# Patient Record
Sex: Female | Born: 1970 | Race: White | Hispanic: No | Marital: Single | Smoking: Never smoker
Health system: Southern US, Community
[De-identification: ages and names within clinical notes are randomized; demographics above are authoritative.]

## PROBLEM LIST (undated history)

## (undated) DIAGNOSIS — E039 Hypothyroidism, unspecified: Secondary | ICD-10-CM

## (undated) DIAGNOSIS — E559 Vitamin D deficiency, unspecified: Secondary | ICD-10-CM

## (undated) DIAGNOSIS — B360 Pityriasis versicolor: Secondary | ICD-10-CM

## (undated) DIAGNOSIS — F419 Anxiety disorder, unspecified: Secondary | ICD-10-CM

## (undated) HISTORY — DX: Hypothyroidism, unspecified: E03.9

## (undated) HISTORY — DX: Anxiety disorder, unspecified: F41.9

## (undated) HISTORY — DX: Vitamin D deficiency, unspecified: E55.9

## (undated) HISTORY — DX: Pityriasis versicolor: B36.0

---

## 2008-11-03 ENCOUNTER — Ambulatory Visit (HOSPITAL_COMMUNITY): Admission: RE | Admit: 2008-11-03 | Discharge: 2008-11-03 | Payer: Self-pay | Admitting: Internal Medicine

## 2008-11-18 ENCOUNTER — Encounter (HOSPITAL_COMMUNITY): Admission: RE | Admit: 2008-11-18 | Discharge: 2008-12-30 | Payer: Self-pay | Admitting: Internal Medicine

## 2009-07-07 ENCOUNTER — Ambulatory Visit (HOSPITAL_COMMUNITY): Admission: RE | Admit: 2009-07-07 | Discharge: 2009-07-07 | Payer: Self-pay | Admitting: Internal Medicine

## 2010-01-22 ENCOUNTER — Encounter: Payer: Self-pay | Admitting: Internal Medicine

## 2010-12-11 ENCOUNTER — Ambulatory Visit
Admission: RE | Admit: 2010-12-11 | Discharge: 2010-12-11 | Disposition: A | Discharge status: Home / Self Care | Payer: BC Managed Care – PPO | Source: Ambulatory Visit | Attending: Internal Medicine | Admitting: Internal Medicine

## 2010-12-11 ENCOUNTER — Other Ambulatory Visit: Payer: Self-pay | Admitting: Internal Medicine

## 2010-12-11 DIAGNOSIS — Z331 Pregnant state, incidental: Secondary | ICD-10-CM

## 2012-02-22 IMAGING — US US OB TRANSVAGINAL
1 series · 14 of 28 positions shown · non-contrast
Comparison: None.

CLINICAL DATA: Positive urine pregnancy test.  Irregular menstrual
cycles.

OBSTETRIC <14 WK US AND TRANSVAGINAL OB US
TECHNIQUE: Both transabdominal and transvaginal ultrasound
examinations were performed for complete evaluation of the
gestation as well as the maternal uterus, adnexal regions, and
pelvic cul-de-sac.  Transvaginal technique was performed to assess
early pregnancy.

[Series 1: us ob transvaginal · 0.25mm/px · 14 of 65 slices shown]
[im 3/65]
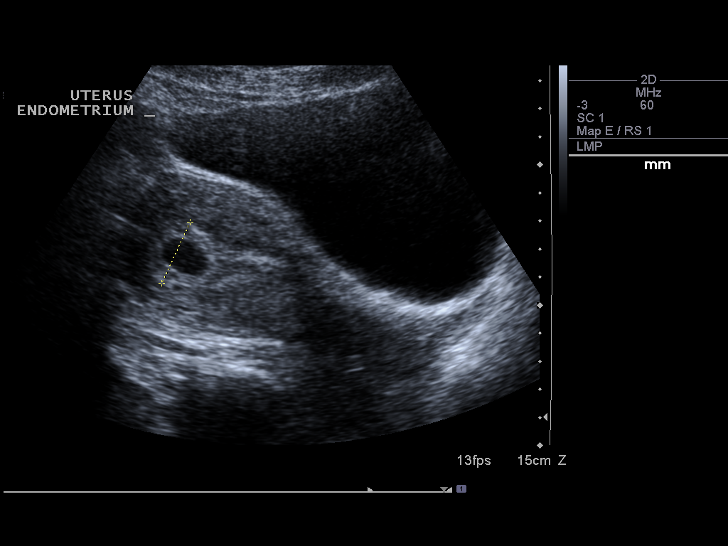
[im 8/65]
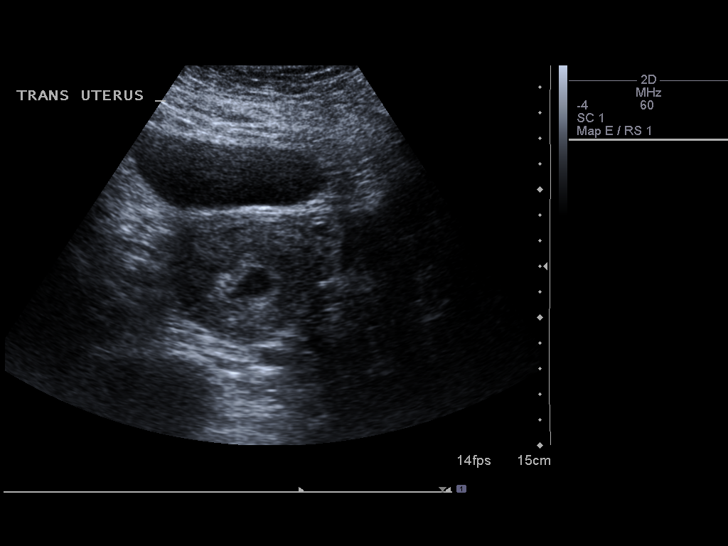
[im 12/65]
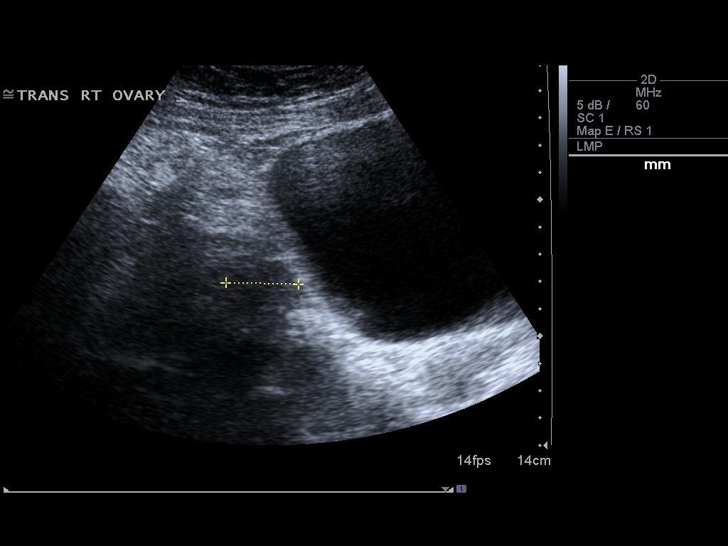
[im 17/65]
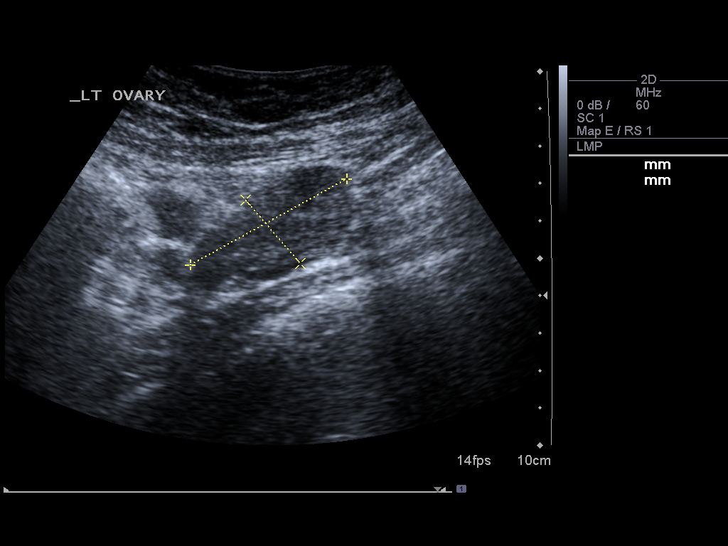
[im 22/65]
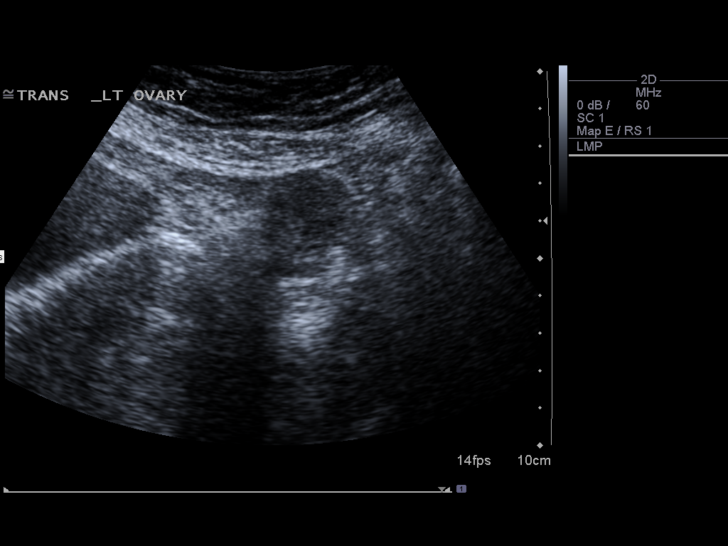
[im 27/65]
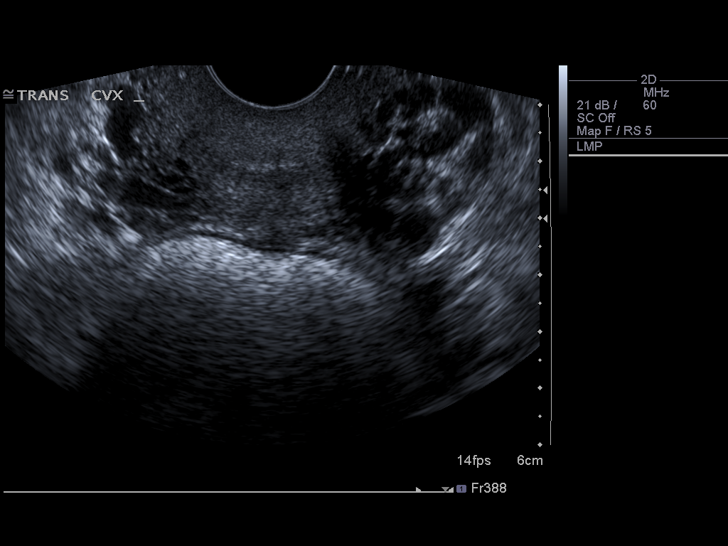
[im 31/65]
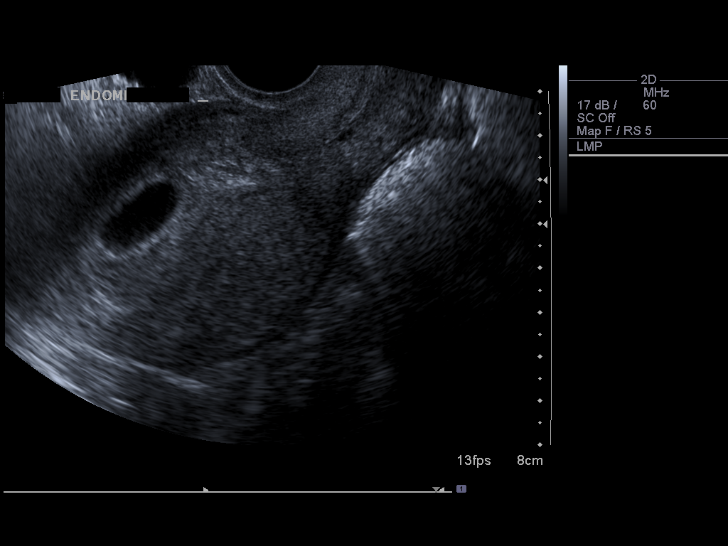
[im 36/65]
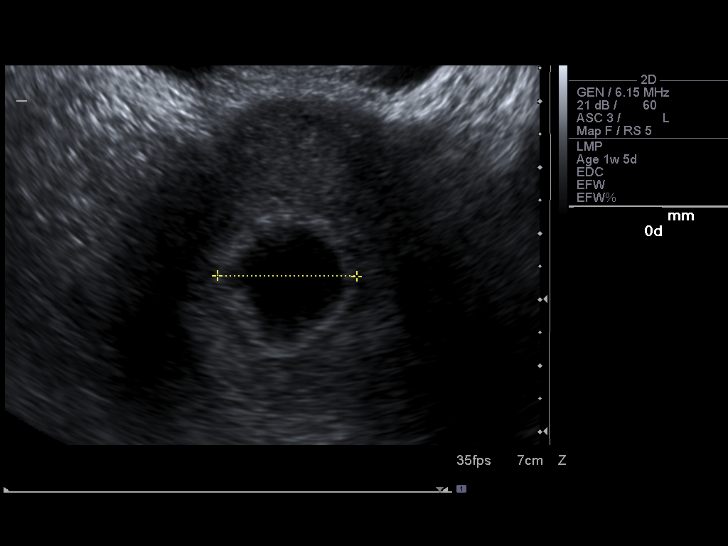
[im 41/65]
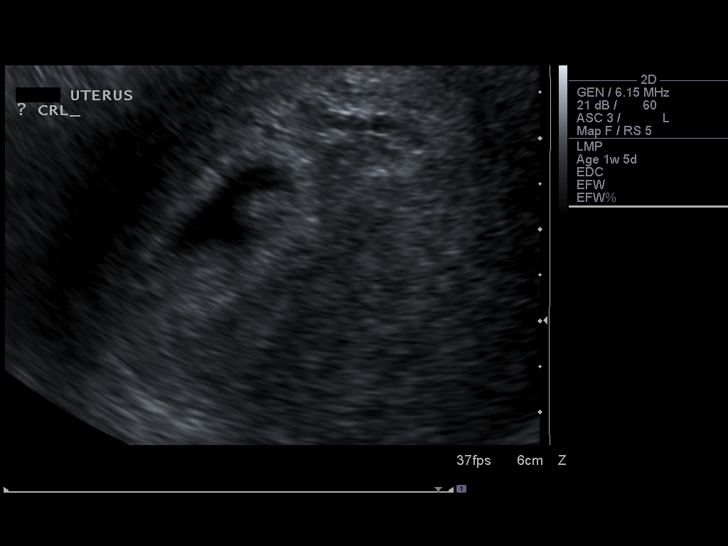
[im 46/65]
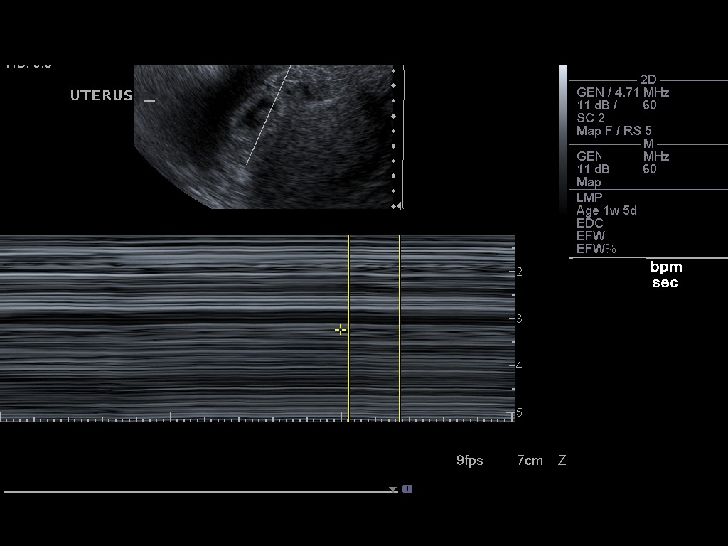
[im 50/65]
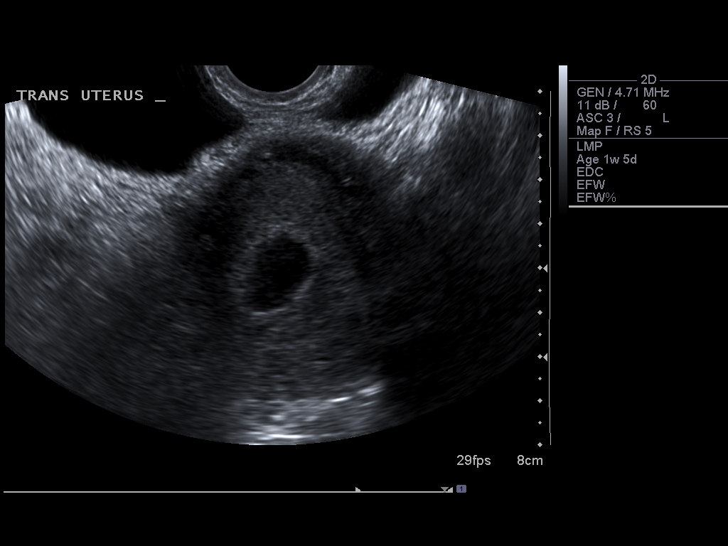
[im 55/65]
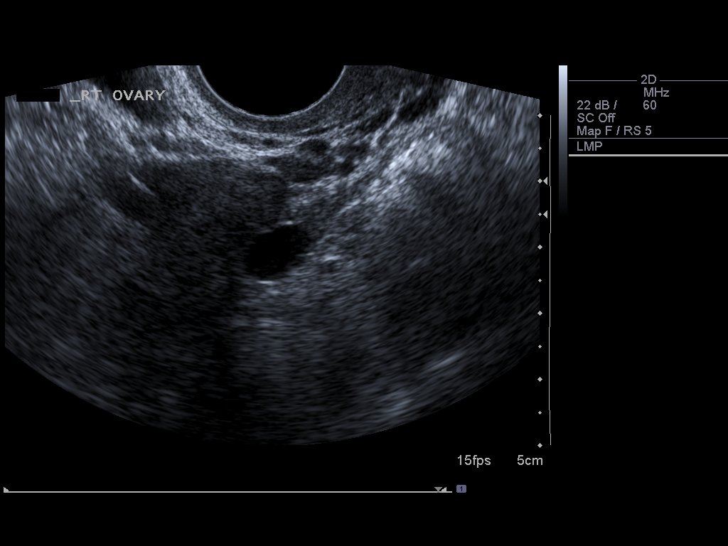
[im 60/65]
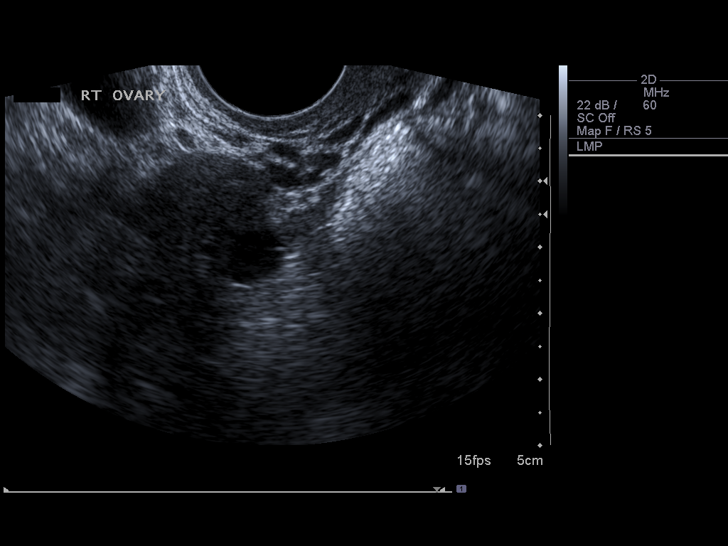
[im 65/65]
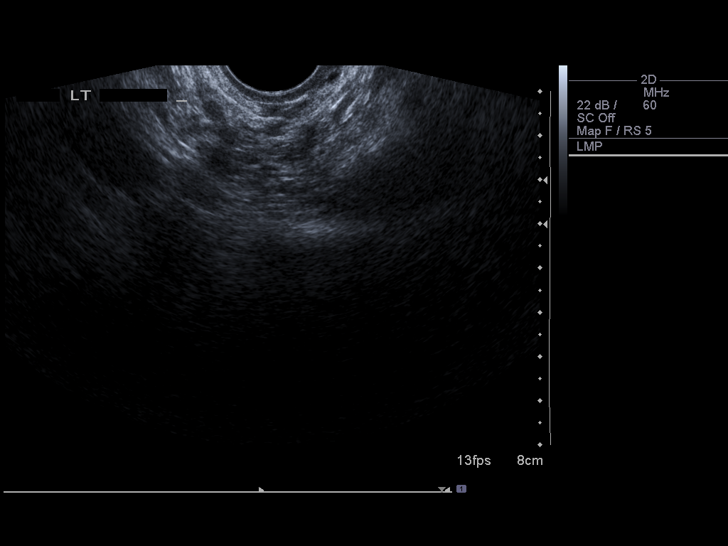

[14 of 28 positions shown; findings below may reference images not displayed]

Intrauterine gestational sac:  Visualized/normal in shape.
Yolk sac: Visualized
Embryo: Visualized
Cardiac Activity: Visualized
Heart Rate: 200 bpm

MSD: 19.3  mm  6    w 6    d
CRL: 7.1   mm  6   w  4   d         US EDC: 08/01/2011

Maternal uterus/adnexae:
No evidence of subchorionic hemorrhage.  The maternal right ovary
is 2.5 x 2.4 x 2.4 cm and contains a partially exophytic follicle
versus corpus luteum that measures 11 mm.  The maternal left ovary
measures 3.2 x 4.8 x 2.2 cm and has normal appearances.  No free
pelvic fluid is seen.
IMPRESSION: Single living intrauterine pregnancy.  Estimated gestational age by
crown-rump length is 6 weeks 4 days with an EDC of 08/01/2011.

age is recommended.

## 2013-09-27 DIAGNOSIS — B360 Pityriasis versicolor: Secondary | ICD-10-CM | POA: Insufficient documentation

## 2013-09-27 DIAGNOSIS — E039 Hypothyroidism, unspecified: Secondary | ICD-10-CM | POA: Insufficient documentation

## 2013-09-27 DIAGNOSIS — E559 Vitamin D deficiency, unspecified: Secondary | ICD-10-CM | POA: Insufficient documentation

## 2013-09-27 DIAGNOSIS — F419 Anxiety disorder, unspecified: Secondary | ICD-10-CM | POA: Insufficient documentation

## 2013-09-28 ENCOUNTER — Encounter: Payer: Self-pay | Admitting: Physician Assistant

## 2013-12-04 ENCOUNTER — Encounter: Payer: Self-pay | Admitting: *Deleted

## 2013-12-04 ENCOUNTER — Emergency Department
Admission: EM | Admit: 2013-12-04 | Discharge: 2013-12-04 | Disposition: A | Discharge status: Home / Self Care | Payer: BLUE CROSS/BLUE SHIELD | Source: Home / Self Care | Attending: Family Medicine | Admitting: Family Medicine

## 2013-12-04 DIAGNOSIS — I8312 Varicose veins of left lower extremity with inflammation: Secondary | ICD-10-CM

## 2013-12-04 DIAGNOSIS — I8311 Varicose veins of right lower extremity with inflammation: Secondary | ICD-10-CM | POA: Diagnosis not present

## 2013-12-04 DIAGNOSIS — I872 Venous insufficiency (chronic) (peripheral): Secondary | ICD-10-CM

## 2013-12-04 MED ORDER — HYDROXYZINE HCL 25 MG PO TABS: ORAL_TABLET | ORAL | Status: DC

## 2013-12-04 MED ORDER — TRIAMCINOLONE ACETONIDE 0.1 % EX CREA: TOPICAL_CREAM | CUTANEOUS | Status: AC

## 2013-12-04 NOTE — ED Notes (Signed)
Pt c/o itching rash on her lower legs bilaterally x 1 mth. She reports having this rash 2 times a year. Denies any recent changes to her diet, soap or laundry detergent.

## 2013-12-04 NOTE — Discharge Instructions (Signed)
Minimize baths/showers.  Use a mild bath soap containing oil such as unscented Dove.  Apply a moisturizing cream or lotion immediately after bathing while still wet, then towel dry.  Recommend wearing below the knee support hose daytime (mild support: 8-515mm HG compression)   Stasis Dermatitis Stasis dermatitis occurs when veins lose the ability to pump blood back to the heart (poor venous circulation). It causes a reddish-purple to brownish scaly, itchy rash on the legs. The rash comes from pooling of blood (stasis). CAUSES  This occurs because the veins do not work very well anymore or because pressure may be increased in the veins due to other conditions. With blood pooling, the increased pressure in the tiny blood vessels (capillaries) causes fluid to leak out of the capillaries into the tissue. The extra fluid makes it harder for the blood to feed the cells and get rid of waste products. SYMPTOMS  Stasis dermatitis appears as red, scaly, itchy patches on the legs. A yellowish or light brown discoloration is also present. Due to scratching or other injury, these patches can become an ulcer. This ulcer may remain for long periods of time. The ulcer can also become infected. Swelling of the legs is often present with stasis dermatitis. If the leg is swollen, this increases the risk of infection and further damage to the skin. Sometimes, intense itching, tingling, and burning occurs before signs of stasis dermatitis appear. You may find yourself scratching the insides of your ankles or rubbing your ankles together before the rash appears. After healing, there are often brown spots on the affected skin. DIAGNOSIS  Your caregiver makes this diagnosis based on an exam. Other tests may be done to better understand the cause. TREATMENT  If underlying conditions are present, they must be treated. Some of these conditions are heart failure, thyroid problems, poor nutrition, and varicose veins.  Cortisone  creams and ointments applied to the skin (topically) may be needed, as well as medicine to reduce swelling in the legs (diuretics).  Compression stockings or an elastic wrap may also be needed to reduce swelling.  If there is an infection, antibiotic medicines may also be used. HOME CARE INSTRUCTIONS   Try to rest and raise (elevate) the affected leg above the level of the heart, if possible.  Burow's solution wet packs applied for 30 minutes, 3 times daily, will help the weepy rash. Stop using the packs before your skin gets too dry. You can also use a mixture of 3 parts white vinegar to 1 quart water.  Grease your legs daily with ointments, such as petroleum jelly, to fight dryness.  Avoid scratching or injuring the area. SEEK IMMEDIATE MEDICAL CARE IF:   Your rash gets worse.  An ulcer forms.  You have an oral temperature above 102 F (38.9 C), not controlled by medicine.  You have any other severe symptoms. Document Released: 03/29/2005 Document Revised: 03/12/2011 Document Reviewed: 05/09/2009 [facility] Patient Information 2015 [place], [place]. This information is not intended to replace advice given to you by your health care provider. Make sure you discuss any questions you have with your health care provider.    Compression Stockings Compression stockings are elastic stockings that "compress" your legs. This helps to increase blood flow, decrease swelling, and reduces the chance of getting blood clots in your lower legs. Compression stockings are used:  After surgery.  If you have a history of poor circulation.  If you are prone to blood clots.  If you have varicose veins.  If you sit or are bedridden for long periods of time. WEARING COMPRESSION STOCKINGS  Your compression stockings should be worn as instructed by your caregiver.  Wearing the correct stocking size is important. Your caregiver can help measure and fit you to the correct size.  When wearing your  stockings, do not allow the stockings to bunch up. This is especially important around your toes or behind your knees. Keep the stockings as smooth as possible.  Do not roll the stockings downward and leave them rolled down. This can form a restrictive band around your legs and can decrease blood flow.  The stockings should be removed once a day for 1 hour or as instructed by your caregiver. When the stockings are taken off, inspect your legs and feet. Look for:  Open sores.  Red spots.  Puffy areas (swelling).  Anything that does not seem normal. IMPORTANT INFORMATION ABOUT COMPRESSION STOCKINGS  The compression stockings should be clean, dry, and in good condition before you put them on.  Do not put lotion on your legs or feet. This makes it harder to put the stockings on.  Change your stockings immediately if they become wet or soiled.  Do not wear stockings that are ripped or torn.  You may hand-wash or put your stockings in the washing machine. Use cold or warm water with mild detergent. Do not bleach your stockings. They may be air-dried or dried in the dryer on low heat.  If you have pain or have a feeling of "pins and needles" in your feet or legs, you may be wearing stockings that are too tight. Call your caregiver right away. SEEK IMMEDIATE MEDICAL CARE IF:   You have numbness or tingling in your lower legs that does not get better quickly after the stockings are removed.  Your toes or feet become cold and blue.  You develop open sores or have red spots on your legs that do not go away. MAKE SURE YOU:   Understand these instructions.  Will watch your condition.  Will get help right away if you are not doing well or get worse. Document Released: 10/15/2008 Document Revised: 03/12/2011 Document Reviewed: 10/15/2008 [facility] Patient Information 2015 [place], [place]. This information is not intended to replace advice given to you by your health care provider. Make sure  you discuss any questions you have with your health care provider.

## 2013-12-04 NOTE — ED Provider Notes (Signed)
CSN: [id number]     Arrival date & time 12/04/13  1645 History   First MD Initiated Contact with Patient 12/04/13 1740     Chief Complaint  Patient presents with  . Rash      HPI Comments: Patient complains of recurring pruritic rash on both lower legs below the knee for about a month.  She states that she generally has the rash in spring and fall.  She states that the rash first appeared 19 years ago during the third trimester of her pregnancy when she had significant swelling in her lower legs.  She notes that her legs swell during the day.  She usually develops itching of her lower legs, and rash appears when she begins to scratch them.  She notes that the itching awakens her at night. She has had good response in the past to triamcinolone cream.  Patient is a 43 y.o. female presenting with rash. The history is provided by the patient.  Rash Location: lower legs. Quality comment:  Itching Severity:  Mild Onset quality:  Gradual Duration:  1 month Timing:  Constant Progression:  Worsening Chronicity:  Recurrent Context: not animal contact, not chemical exposure, not eggs, not hot tub use, not insect bite/sting, not medications, not new detergent/soap and not plant contact   Relieved by:  Topical steroids Ineffective treatments:  Antihistamines Associated symptoms: no fatigue, no fever, no joint pain, no myalgias, no shortness of breath, no sore throat and no URI     Past Medical History  Diagnosis Date  . Hypothyroid   . Anxiety   . Tinea versicolor   . Vitamin D deficiency    History reviewed. No pertinent past surgical history. Family History  Problem Relation Age of Onset  . Hypertension Mother   . Arthritis Mother    History  Substance Use Topics  . Smoking status: Never Smoker   . Smokeless tobacco: Never Used  . Alcohol Use: No     Comment: Rare   OB History    No data available     Review of Systems  Constitutional: Negative for fever and fatigue.  HENT:  Negative for sore throat.   Respiratory: Negative for shortness of breath.   Musculoskeletal: Negative for myalgias and arthralgias.  Skin: Positive for rash.  All other systems reviewed and are negative.   Allergies  Shellfish allergy  Home Medications   Prior to Admission medications   Medication Sig Start Date End Date Taking? Authorizing Provider  hydrOXYzine (ATARAX/VISTARIL) 25 MG tablet Take one or two tabs by mouth at bedtime as needed for itching 12/04/13   Lattie HawStephen A Beese, MD  levothyroxine (SYNTHROID, LEVOTHROID) 50 MCG tablet Take 50 mcg by mouth daily before breakfast.    Historical Provider, MD  triamcinolone cream (KENALOG) 0.1 % Apply thin application to lower legs twice daily until condition improves. 12/04/13   Lattie HawStephen A Beese, MD   BP 126/82 mmHg  Pulse 83  Temp(Src) 97.6 F (36.4 C) (Oral)  Resp 18  Ht 5\' 7"  (1.702 m)  Wt 182 lb (82.555 kg)  BMI 28.50 kg/m2  SpO2 100%  LMP 11/30/2013 Physical Exam  Constitutional: She is oriented to person, place, and time. She appears well-developed and well-nourished. No distress.  HENT:  Head: Normocephalic.  Mouth/Throat: Oropharynx is clear and moist.  Eyes: Conjunctivae are normal. Pupils are equal, round, and reactive to light.  Neck: Neck supple.  Cardiovascular: Normal heart sounds.   Pulmonary/Chest: Breath sounds normal.  Abdominal: There is  no tenderness.  Musculoskeletal: She exhibits edema. She exhibits no tenderness.       Right lower leg: She exhibits edema. She exhibits no tenderness, no bony tenderness and no swelling.       Left lower leg: She exhibits edema.       Legs: Lower extremities below the knees have trace edema.  There is scattered follicular erythema as noted on diagram.  Right lateral pre-tibial area has macular erythematous chronic changes as noted on diagram.      Lymphadenopathy:    She has no cervical adenopathy.  Neurological: She is alert and oriented to person, place, and time.    Skin: Skin is warm and dry.  Nursing note and vitals reviewed.   ED Course  Procedures  none      MDM   1. Acute venous stasis dermatitis of both lower extremities    Begin triamcinolone 0.1% cream BID until rash improves.  Vistaril at bedtime for itching. Minimize baths/showers.  Use a mild bath soap containing oil such as unscented Dove.  Apply a moisturizing cream or lotion immediately after bathing while still wet, then towel dry.  Recommend wearing below the knee support hose daytime (mild support: 8-1415mm HG compression) Followup with dermatologist if not improving.    Lattie HawStephen A Beese, MD 12/08/13 [phone]

## 2017-07-03 ENCOUNTER — Other Ambulatory Visit: Payer: Self-pay

## 2017-07-03 ENCOUNTER — Emergency Department (INDEPENDENT_AMBULATORY_CARE_PROVIDER_SITE_OTHER): Payer: BLUE CROSS/BLUE SHIELD

## 2017-07-03 ENCOUNTER — Emergency Department (INDEPENDENT_AMBULATORY_CARE_PROVIDER_SITE_OTHER)
Admission: EM | Admit: 2017-07-03 | Discharge: 2017-07-03 | Disposition: A | Discharge status: Home / Self Care | Payer: BLUE CROSS/BLUE SHIELD | Source: Home / Self Care | Attending: Family Medicine | Admitting: Family Medicine

## 2017-07-03 DIAGNOSIS — M25512 Pain in left shoulder: Secondary | ICD-10-CM

## 2017-07-03 MED ORDER — HYDROCODONE-ACETAMINOPHEN 5-325 MG PO TABS: 1.0000 | ORAL_TABLET | Freq: Four times a day (QID) | ORAL | 0 refills | Status: DC | PRN

## 2017-07-03 MED ORDER — METHOCARBAMOL 500 MG PO TABS: 500.0000 mg | ORAL_TABLET | Freq: Two times a day (BID) | ORAL | 0 refills | Status: DC

## 2017-07-03 NOTE — ED Provider Notes (Signed)
[facility]    CSN: [id number] Arrival date & time: 07/03/17  0944     History   Chief Complaint Chief Complaint  Patient presents with  . Shoulder Pain    left    HPI Yolanda Kramer is a 47 y.o. female.   HPI Yolanda Kramer is a 47 y.o. female presenting to UC with c/o Left shoulder pain that start a few months ago. Pain is a dull ache, worse when lying on her Left side and radiates around her Left scapula.  Pain initially started one night after going to the gym earlier in the day but no specific known injury.  She has decreased ROM due to the pain.  She has taken Aleve but minimal relief.  Denies numbness down her arm.  No prior hx of shoulder issues.   Past Medical History:  Diagnosis Date  . Anxiety   . Hypothyroid   . Tinea versicolor   . Vitamin D deficiency     Patient Active Problem List   Diagnosis Date Noted  . Hypothyroid   . Anxiety   . Tinea versicolor   . Vitamin D deficiency     History reviewed. No pertinent surgical history.  OB History   None      Home Medications    Prior to Admission medications   Medication Sig Start Date End Date Taking? Authorizing Provider  HYDROcodone-acetaminophen (NORCO/VICODIN) 5-325 MG tablet Take 1 tablet by mouth every 6 (six) hours as needed. 07/03/17   Lurene ShadowPhelps, Denene Alamillo O, PA-C  hydrOXYzine (ATARAX/VISTARIL) 25 MG tablet Take one or two tabs by mouth at bedtime as needed for itching 12/04/13   Lattie HawBeese, Stephen A, MD  levothyroxine (SYNTHROID, LEVOTHROID) 50 MCG tablet Take 50 mcg by mouth daily before breakfast.    [provider]  methocarbamol (ROBAXIN) 500 MG tablet Take 1 tablet (500 mg total) by mouth 2 (two) times daily. 07/03/17   Lurene ShadowPhelps, Joslynn Jamroz O, PA-C  triamcinolone cream (KENALOG) 0.1 % Apply thin application to lower legs twice daily until condition improves. 12/04/13   Lattie HawBeese, Stephen A, MD    Family History Family History  Problem Relation Age of Onset  . Hypertension Mother   .  Arthritis Mother     Social History Social History   Tobacco Use  . Smoking status: Never Smoker  . Smokeless tobacco: Never Used  Substance Use Topics  . Alcohol use: No    Comment: Rare  . Drug use: No     Allergies   Shellfish allergy   Review of Systems Review of Systems  Musculoskeletal: Positive for arthralgias, back pain and myalgias.  Skin: Negative for color change and rash.  Neurological: Positive for weakness. Negative for numbness.     Physical Exam Triage Vital Signs ED Triage Vitals [07/03/17 1003]  Enc Vitals Group     BP 125/79     Pulse Rate 80     Resp      Temp 98.2 F (36.8 C)     Temp Source Oral     SpO2 97 %     Weight 209 lb (94.8 kg)     Height 5\' 7"  (1.702 m)     Head Circumference      Peak Flow      Pain Score 7     Pain Loc      Pain Edu?      Excl. in GC?    No data found.  Updated Vital Signs BP  125/79 (BP Location: Right Arm)   Pulse 80   Temp 98.2 F (36.8 C) (Oral)   Ht 5\' 7"  (1.702 m)   Wt 209 lb (94.8 kg)   SpO2 97%   BMI 32.73 kg/m   Visual Acuity Right Eye Distance:   Left Eye Distance:   Bilateral Distance:    Right Eye Near:   Left Eye Near:    Bilateral Near:     Physical Exam  Constitutional: She is oriented to person, place, and time. She appears well-developed and well-nourished. No distress.  HENT:  Head: Normocephalic and atraumatic.  Eyes: EOM are normal.  Neck: Normal range of motion.  Cardiovascular: Normal rate.  Pulses:      Radial pulses are 2+ on the left side.  Pulmonary/Chest: Effort normal.  Musculoskeletal: She exhibits tenderness. She exhibits no edema.       Left shoulder: She exhibits decreased range of motion and tenderness.       Arms: Left shoulder: decreased ROM, tenderness from Left side neck around scapula and shoulder joint.  Left elbow: non-tender, full ROM  Neurological: She is alert and oriented to person, place, and time.  Skin: Skin is warm and dry. Capillary  refill takes less than 2 seconds. She is not diaphoretic. No erythema.  Psychiatric: She has a normal mood and affect. Her behavior is normal.  Nursing note and vitals reviewed.    UC Treatments / Results  Labs (all labs ordered are listed, but only abnormal results are displayed) Labs Reviewed - No data to display  EKG None  Radiology Dg Shoulder Left  Result Date: 07/03/2017 CLINICAL DATA:  Pain for 2 months EXAM: LEFT SHOULDER - 2+ VIEW COMPARISON:  None. FINDINGS: Frontal, Y scapular, and axillary images were obtained. No fracture or dislocation. Joint spaces appear normal. No erosive change. Visualized left lung clear. IMPRESSION: No fracture or dislocation.  No evident arthropathy. Electronically Signed   By: Bretta Bang III M.D.   On: 07/03/2017 10:32    Procedures Procedures (including critical care time)  Medications Ordered in UC Medications - No data to display  Initial Impression / Assessment and Plan / UC Course  I have reviewed the triage vital signs and the nursing notes.  Pertinent labs & imaging results that were available during my care of the patient were reviewed by me and considered in my medical decision making (see chart for details).     Discussed plain films with pt. Given severity of pain and decreased ROM, questionable small RCT vs tendonitis. Home care instructions provided below.    Final Clinical Impressions(s) / UC Diagnoses   Final diagnoses:  Left shoulder pain     Discharge Instructions      Norco/Vicodin (hydrocodone-acetaminophen) is a narcotic pain medication, do not combine these medications with others containing tylenol. While taking, do not drink alcohol, drive, or perform any other activities that requires focus while taking these medications.   Robaxin (methocarbamol) is a muscle relaxer and may cause drowsiness. Do not drink alcohol, drive, or operate heavy machinery while taking.  You may also continue to take your  Advil or Aleve during the day and at night to help with inflammation and pain.  Do not take more than recommended on product packaging as this could cause stomach upset and even stomach ulcers if taking for prolonged time.  Please follow up with Sports Medicine in 1-2 weeks if not improving.  You may need additional imaging with an ultrasound or MRI  and may benefit from a shoulder joint injection or physical therapy.     ED Prescriptions    Medication Sig Dispense Auth. Provider   methocarbamol (ROBAXIN) 500 MG tablet Take 1 tablet (500 mg total) by mouth 2 (two) times daily. 20 tablet Lurene Shadow, PA-C   HYDROcodone-acetaminophen (NORCO/VICODIN) 5-325 MG tablet Take 1 tablet by mouth every 6 (six) hours as needed. 12 tablet Lurene Shadow, PA-C     Controlled Substance Prescriptions [place] Controlled Substance Registry consulted? Yes, I have consulted the [place] Controlled Substances Registry for this patient, and feel the risk/benefit ratio today is favorable for proceeding with this prescription for a controlled substance.   Lurene Shadow, [place] 07/03/17 1142

## 2017-07-03 NOTE — Discharge Instructions (Signed)
°  Norco/Vicodin (hydrocodone-acetaminophen) is a narcotic pain medication, do not combine these medications with others containing tylenol. While taking, do not drink alcohol, drive, or perform any other activities that requires focus while taking these medications.   Robaxin (methocarbamol) is a muscle relaxer and may cause drowsiness. Do not drink alcohol, drive, or operate heavy machinery while taking.  You may also continue to take your Advil or Aleve during the day and at night to help with inflammation and pain.  Do not take more than recommended on product packaging as this could cause stomach upset and even stomach ulcers if taking for prolonged time.  Please follow up with Sports Medicine in 1-2 weeks if not improving.  You may need additional imaging with an ultrasound or MRI and may benefit from a shoulder joint injection or physical therapy.

## 2017-07-03 NOTE — ED Triage Notes (Signed)
Has been having left shoulder pain for a couple of months.  Has been a dull ache and hurts worse when lying on that side.  Pain down the back of arm, limited ROM.

## 2017-08-30 ENCOUNTER — Emergency Department (INDEPENDENT_AMBULATORY_CARE_PROVIDER_SITE_OTHER)
Admission: EM | Admit: 2017-08-30 | Discharge: 2017-08-30 | Disposition: A | Discharge status: Home / Self Care | Payer: BLUE CROSS/BLUE SHIELD | Source: Home / Self Care | Attending: Family Medicine | Admitting: Family Medicine

## 2017-08-30 ENCOUNTER — Other Ambulatory Visit: Payer: Self-pay

## 2017-08-30 ENCOUNTER — Encounter: Payer: Self-pay | Admitting: *Deleted

## 2017-08-30 DIAGNOSIS — M25512 Pain in left shoulder: Secondary | ICD-10-CM

## 2017-08-30 DIAGNOSIS — G8929 Other chronic pain: Secondary | ICD-10-CM | POA: Diagnosis not present

## 2017-08-30 MED ORDER — MELOXICAM 15 MG PO TABS: 15.0000 mg | ORAL_TABLET | Freq: Every day | ORAL | 1 refills | Status: DC

## 2017-08-30 NOTE — ED Provider Notes (Signed)
[facility]    CSN: [id number] Arrival date & time: 08/30/17  1549     History   Chief Complaint Chief Complaint  Patient presents with  . Shoulder Pain    HPI Yolanda Maudlinancy L Kramer is a 47 y.o. female.   Patient complains of several month history of left shoulder pain without preceding injury.  She was evaluated 07/03/17 with negative shoulder x-rays during that visit.  Her shoulder has not improved with conservative treatment of Robaxin and shoulder exercises.  The history is provided by the patient.  Shoulder Pain  Location:  Shoulder Shoulder location:  L shoulder Injury: no   Pain details:    Quality:  Aching   Radiates to:  Does not radiate   Severity:  Mild   Onset quality:  Gradual   Duration:  4 months   Timing:  Constant   Progression:  Worsening Handedness:  Right-handed Dislocation: no   Prior injury to area:  No Relieved by:  Nothing Exacerbated by: abduction. Ineffective treatments:  Muscle relaxant (exercises) Associated symptoms: decreased range of motion   Associated symptoms: no back pain, no fatigue, no fever, no muscle weakness, no neck pain, no numbness, no swelling and no tingling     Past Medical History:  Diagnosis Date  . Anxiety   . Hypothyroid   . Tinea versicolor   . Vitamin D deficiency     Patient Active Problem List   Diagnosis Date Noted  . Hypothyroid   . Anxiety   . Tinea versicolor   . Vitamin D deficiency     History reviewed. No pertinent surgical history.  OB History   None      Home Medications    Prior to Admission medications   Medication Sig Start Date End Date Taking? Authorizing Provider  HYDROcodone-acetaminophen (NORCO/VICODIN) 5-325 MG tablet Take 1 tablet by mouth every 6 (six) hours as needed. 07/03/17   Lurene ShadowPhelps, Erin O, PA-C  hydrOXYzine (ATARAX/VISTARIL) 25 MG tablet Take one or two tabs by mouth at bedtime as needed for itching 12/04/13   Lattie HawBeese, Stephen A, MD  levothyroxine (SYNTHROID,  LEVOTHROID) 50 MCG tablet Take 50 mcg by mouth daily before breakfast.    [provider]  meloxicam (MOBIC) 15 MG tablet Take 1 tablet (15 mg total) by mouth daily. Take with food each morning 08/30/17   Lattie HawBeese, Stephen A, MD  triamcinolone cream (KENALOG) 0.1 % Apply thin application to lower legs twice daily until condition improves. 12/04/13   Lattie HawBeese, Stephen A, MD    Family History Family History  Problem Relation Age of Onset  . Hypertension Mother   . Arthritis Mother     Social History Social History   Tobacco Use  . Smoking status: Never Smoker  . Smokeless tobacco: Never Used  Substance Use Topics  . Alcohol use: No    Comment: Rare  . Drug use: No     Allergies   Shellfish allergy   Review of Systems Review of Systems  Constitutional: Negative for fatigue and fever.  Musculoskeletal: Negative for back pain and neck pain.  All other systems reviewed and are negative.    Physical Exam Triage Vital Signs ED Triage Vitals [08/30/17 1620]  Enc Vitals Group     BP 125/84     Pulse Rate 70     Resp      Temp      Temp src      SpO2      Weight 206  lb (93.4 kg)     Height      Head Circumference      Peak Flow      Pain Score 7     Pain Loc      Pain Edu?      Excl. in GC?    No data found.  Updated Vital Signs BP 125/84 (BP Location: Right Arm)   Pulse 70   Wt 93.4 kg   LMP 08/20/2017   BMI 32.26 kg/m   Visual Acuity Right Eye Distance:   Left Eye Distance:   Bilateral Distance:    Right Eye Near:   Left Eye Near:    Bilateral Near:     Physical Exam  Constitutional: She appears well-developed and well-nourished. No distress.  HENT:  Head: Normocephalic.  Eyes: Pupils are equal, round, and reactive to light.  Neck: Normal range of motion.  Cardiovascular: Normal rate.  Pulmonary/Chest: Effort normal.  Musculoskeletal:       Left shoulder: She exhibits decreased range of motion, tenderness and decreased strength. She exhibits  no crepitus and normal pulse.  Left shoulder has decreased external rotation and strength.  The patient has difficulty actively abducting above the horizontal, and cannot passively abduct more than about 10 degrees above horizontal.  Apley's test and empty can tests positive.  There is minimal tenderness to palpation.  Distal neurovascular function is intact.   Neurological: She is alert.  Skin: Skin is warm and dry. No rash noted.  Nursing note and vitals reviewed.    UC Treatments / Results  Labs (all labs ordered are listed, but only abnormal results are displayed) Labs Reviewed - No data to display  EKG None  Radiology No results found.  Procedures Procedures (including critical care time)  Medications Ordered in UC Medications - No data to display  Initial Impression / Assessment and Plan / UC Course  I have reviewed the triage vital signs and the nursing notes.  Pertinent labs & imaging results that were available during my care of the patient were reviewed by me and considered in my medical decision making (see chart for details).    Suspect rotator cuff tendonitis. Begin Mobic 15mg . Followup with Dr. Clementeen Graham ([facility]) for further evaluation.    Final Clinical Impressions(s) / UC Diagnoses   Final diagnoses:  Chronic left shoulder pain     Discharge Instructions     May take Tylenol as needed for pain. May do pendulum exercises several times daily.    ED Prescriptions    Medication Sig Dispense Auth. Provider   meloxicam (MOBIC) 15 MG tablet Take 1 tablet (15 mg total) by mouth daily. Take with food each morning 15 tablet Lattie Haw, MD        Lattie Haw, MD 09/03/17 [phone]

## 2017-08-30 NOTE — Discharge Instructions (Addendum)
May take Tylenol as needed for pain. May do pendulum exercises several times daily.

## 2017-08-30 NOTE — ED Triage Notes (Signed)
Patient c/o left shoulder pain for several months. Seen 07/03/17, negative x-ray. Robaxin is not helping the pain.

## 2017-09-04 ENCOUNTER — Institutional Professional Consult (permissible substitution): Payer: BLUE CROSS/BLUE SHIELD | Admitting: Family Medicine

## 2017-09-11 ENCOUNTER — Encounter: Payer: Self-pay | Admitting: Family Medicine

## 2017-09-11 ENCOUNTER — Ambulatory Visit (INDEPENDENT_AMBULATORY_CARE_PROVIDER_SITE_OTHER): Payer: BLUE CROSS/BLUE SHIELD | Admitting: Family Medicine

## 2017-09-11 VITALS — BP 127/73 | HR 78 | Ht 67.0 in | Wt 215.0 lb

## 2017-09-11 DIAGNOSIS — M7502 Adhesive capsulitis of left shoulder: Secondary | ICD-10-CM | POA: Diagnosis not present

## 2017-09-11 DIAGNOSIS — E039 Hypothyroidism, unspecified: Secondary | ICD-10-CM | POA: Diagnosis not present

## 2017-09-11 DIAGNOSIS — E559 Vitamin D deficiency, unspecified: Secondary | ICD-10-CM | POA: Diagnosis not present

## 2017-09-11 MED ORDER — NAPROXEN 500 MG PO TABS: 500.0000 mg | ORAL_TABLET | Freq: Two times a day (BID) | ORAL | 1 refills | Status: AC

## 2017-09-11 MED ORDER — ALPRAZOLAM 0.25 MG PO TABS: ORAL_TABLET | ORAL | 0 refills | Status: AC

## 2017-09-11 NOTE — Patient Instructions (Addendum)
Thank you for coming in today. Try naproxen for pain  Schedule PT.  You should hear about MRI.  Let me know if you do not hear about MRI.  Recheck with me a few days after MRI to go over results in detail.   Get labs now.    Adhesive Capsulitis Adhesive capsulitis is inflammation of the tendons and ligaments that surround the shoulder joint (shoulder capsule). This condition causes the shoulder to become stiff and painful to move. Adhesive capsulitis is also called frozen shoulder. What are the causes? This condition may be caused by:  An injury to the shoulder joint.  Straining the shoulder.  Not moving the shoulder for a period of time. This can happen if your arm was injured or in a sling.  Long-standing health problems, such as: ? Diabetes. ? Thyroid problems. ? Heart disease. ? Stroke. ? Rheumatoid arthritis. ? Lung disease.  In some cases, the cause may not be known. What increases the risk? This condition is more likely to develop in:  Women.  People who are older than 47 years of age.  What are the signs or symptoms? Symptoms of this condition include:  Pain in the shoulder when moving the arm. There may also be pain when parts of the shoulder are touched. The pain is worse at night or when at rest.  Soreness or aching in the shoulder.  Inability to move the shoulder normally.  Muscle spasms.  How is this diagnosed? This condition is diagnosed with a physical exam and imaging tests, such as an X-ray or MRI. How is this treated? This condition may be treated with:  Treatment of the underlying cause or condition.  Physical therapy. This involves performing exercises to get the shoulder moving again.  Medicine. Medicine may be given to relieve pain, inflammation, or muscle spasms.  Steroid injections into the shoulder joint.  Shoulder manipulation. This is a procedure to move the shoulder into another position. It is done after you are given a  medicine to make you fall asleep (general anesthetic). The joint may also be injected with salt water at high pressure to break down scarring.  Surgery. This may be done in severe cases when other treatments have failed.  Although most people recover completely from adhesive capsulitis, some may not regain the full movement of the shoulder. Follow these instructions at home:  Take over-the-counter and prescription medicines only as told by your health care provider.  If you are being treated with physical therapy, follow instructions from your physical therapist.  Avoid exercises that put a lot of demand on your shoulder, such as throwing. These exercises can make pain worse.  If directed, apply ice to the injured area: ? Put ice in a plastic bag. ? Place a towel between your skin and the bag. ? Leave the ice on for 20 minutes, 2-3 times per day. Contact a health care provider if:  You develop new symptoms.  Your symptoms get worse. This information is not intended to replace advice given to you by your health care provider. Make sure you discuss any questions you have with your health care provider. Document Released: 10/15/2008 Document Revised: 05/26/2015 Document Reviewed: 04/12/2014 Elsevier Interactive Patient Education  [employer].

## 2017-09-11 NOTE — Progress Notes (Signed)
Subjective:    CC: Left shoulder pain.  HPI: Clarity notes a 4 to 51-month history of gradual onset left shoulder pain and stiffness.  She notes pain is located predominantly at the left lateral upper arm.  She has pain with reaching back attempting to reach up and at night.  She denies any injury or fall.  The pain is been worsening steadily and become quite significant.  The pain is interfering with her ability to take care of herself at home and sometimes at work.  She denies significant radiating pain weakness or numbness.  She was seen in urgent care on July 3 where she was thought to have a possible rotator cuff injury and was given muscle relaxers and home exercise program.  This failed and she followed back up with urgent care on August 30.  At that time she was prescribed meloxicam which also has not helped.  She is also tried over-the-counter medications which have not helped much at all.  Symptoms have been ongoing now for about 4 months and she is had a treatment failure of typical physician directed conservative management for now greater than 6 weeks.  Patient has a pertinent past medical history significant for hypothyroidism.  She has been off of her thyroid medicine for quite some time now and aside from her shoulder issue is effectively asymptomatic.  She has not had follow-up labs for this in a while.  Past medical history, Surgical history, Family history not pertinant except as noted below, Social history, Allergies, and medications have been entered into the medical record, reviewed, and no changes needed.   Review of Systems: No headache, visual changes, nausea, vomiting, diarrhea, constipation, dizziness, abdominal pain, skin rash, fevers, chills, night sweats, weight loss, swollen lymph nodes, body aches, joint swelling, muscle aches, chest pain, shortness of breath, mood changes, visual or auditory hallucinations.   Objective:    Vitals:   09/11/17 0919  BP: 127/73    Pulse: 78   General: Well Developed, well nourished, and in no acute distress.  Neuro/Psych: Alert and oriented x3, extra-ocular muscles intact, able to move all 4 extremities, sensation grossly intact. Skin: Warm and dry, no rashes noted.  Respiratory: Not using accessory muscles, speaking in full sentences, trachea midline.  Cardiovascular: Pulses palpable, no extremity edema. Abdomen: Does not appear distended. MSK: Left shoulder: Normal-appearing nontender. Range of motion Abduction: 100 degrees active and passive. External rotation 10 degrees beyond the neutral position active and passive Internal rotation limited to the iliac crest. Difficult to get in positioning for Hawkins test. Strength intact within range of motion. Negative Yergason's and speeds test.  Right shoulder normal-appearing nontender full range of motion normal strength negative impingement testing.  His capillary fill and sensation are intact bilateral upper extremities.  C-spine nontender to spinal midline normal neck motion.  Normal reflexes bilateral upper extremities.  Lab and Radiology Results EXAM: LEFT SHOULDER - 2+ VIEW  COMPARISON:  None.  FINDINGS: Frontal, Y scapular, and axillary images were obtained. No fracture or dislocation. Joint spaces appear normal. No erosive change. Visualized left lung clear.  IMPRESSION: No fracture or dislocation.  No evident arthropathy.   Electronically Signed   By: Bretta Bang III M.D.   On: 07/03/2017 10:32  I personally (independently) visualized and performed the interpretation of the images attached in this note.   Impression and Recommendations:    Assessment and Plan: 47 y.o. female with  Left shoulder pain and stiffness and lack of range  of motion very concerning for adhesive capsulitis.  Discussion plan for MRI for injection and potential surgical planning..  We will proceed with formal physical therapy.  Recheck after MRI we will  proceed with injection if warranted at that time.  Additionally will assess for potential causes of adhesive capsulitis with laboratory work-up including TSH CBC and CMP.  Will prescribe limited Xanax for anxiety and claustrophobia around MRI.   Orders Placed This Encounter  Procedures  . MR Shoulder Left Wo Contrast    Standing Status:   Future    Standing Expiration Date:   11/12/2018    Order Specific Question:   What is the patient's sedation requirement?    Answer:   Anti-anxiety    Order Specific Question:   Does the patient have a pacemaker or implanted devices?    Answer:   No    Order Specific Question:   Preferred imaging location?    Answer:   [facility] (table limit-350lbs)    Order Specific Question:   Radiology Contrast Protocol - do NOT remove file path    Answer:   [email or web address]  . TSH  . CBC  . COMPLETE METABOLIC PANEL WITH GFR  . Ambulatory referral to Physical Therapy    Referral Priority:   Routine    Referral Type:   Physical Medicine    Referral Reason:   Specialty Services Required    Requested Specialty:   Physical Therapy   Meds ordered this encounter  Medications  . ALPRAZolam (XANAX) 0.25 MG tablet    Sig: 1-2 tabs 30-60 mins prior to MRI    Dispense:  2 tablet    Refill:  0  . naproxen (NAPROSYN) 500 MG tablet    Sig: Take 1 tablet (500 mg total) by mouth 2 (two) times daily with a meal.    Dispense:  15 tablet    Refill:  1    Discussed warning signs or symptoms. Please see discharge instructions. Patient expresses understanding.

## 2017-09-12 LAB — COMPLETE METABOLIC PANEL WITH GFR
AG RATIO: 1.4 (calc) (ref 1.0–2.5)
ALT: 12 U/L (ref 6–29)
AST: 16 U/L (ref 10–35)
Albumin: 3.9 g/dL (ref 3.6–5.1)
Alkaline phosphatase (APISO): 50 U/L (ref 33–115)
BILIRUBIN TOTAL: 0.5 mg/dL (ref 0.2–1.2)
BUN: 14 mg/dL (ref 7–25)
CHLORIDE: 105 mmol/L (ref 98–110)
CO2: 25 mmol/L (ref 20–32)
Calcium: 8.8 mg/dL (ref 8.6–10.2)
Creat: 0.82 mg/dL (ref 0.50–1.10)
GFR, EST NON AFRICAN AMERICAN: 85 mL/min/{1.73_m2} (ref >=60)
GFR, Est African American: 99 mL/min/{1.73_m2} (ref >=60)
GLOBULIN: 2.7 g/dL (ref 1.9–3.7)
Glucose, Bld: 88 mg/dL (ref 65–139)
POTASSIUM: 4.4 mmol/L (ref 3.5–5.3)
SODIUM: 139 mmol/L (ref 135–146)
TOTAL PROTEIN: 6.6 g/dL (ref 6.1–8.1)

## 2017-09-12 LAB — CBC
HEMATOCRIT: 40.5 % (ref 35.0–45.0)
Hemoglobin: 13.3 g/dL (ref 11.7–15.5)
MCH: 29.8 pg (ref 27.0–33.0)
MCHC: 32.8 g/dL (ref 32.0–36.0)
MCV: 90.8 fL (ref 80.0–100.0)
MPV: 9.8 fL (ref 7.5–12.5)
PLATELETS: 252 10*3/uL (ref 140–400)
RBC: 4.46 10*6/uL (ref 3.80–5.10)
RDW: 12.6 % (ref 11.0–15.0)
WBC: 7.2 10*3/uL (ref 3.8–10.8)

## 2017-09-12 LAB — TSH: TSH: 7.6 mIU/L — ABNORMAL HIGH

## 2017-09-12 LAB — SPECIMEN COMPROMISED

## 2017-09-20 ENCOUNTER — Ambulatory Visit: Payer: BLUE CROSS/BLUE SHIELD | Admitting: Rehabilitative and Restorative Service Providers"

## 2017-09-20 ENCOUNTER — Encounter: Payer: Self-pay | Admitting: Rehabilitative and Restorative Service Providers"

## 2017-09-20 DIAGNOSIS — R293 Abnormal posture: Secondary | ICD-10-CM | POA: Diagnosis not present

## 2017-09-20 DIAGNOSIS — R29898 Other symptoms and signs involving the musculoskeletal system: Secondary | ICD-10-CM

## 2017-09-20 DIAGNOSIS — M25512 Pain in left shoulder: Secondary | ICD-10-CM

## 2017-09-20 NOTE — Therapy (Signed)
[facility] [address] [place], [place], [address] Phone: [phone]   Fax:  [phone]  Physical Therapy Evaluation  Patient Details  Name: Yolanda Kramer MRN: [id number] Date of Birth: 03-Nov-1970 Referring Provider: Dr Clementeen GrahamEvan Corey    Encounter Date: 09/20/2017  PT End of Session - 09/20/17 1354    Visit Number  1    Number of Visits  12    Date for PT Re-Evaluation  11/01/17    PT Start Time  1355    PT Stop Time  1454    PT Time Calculation (min)  59 min    Activity Tolerance  Patient tolerated treatment well       Past Medical History:  Diagnosis Date  . Anxiety   . Hypothyroid   . Tinea versicolor   . Vitamin D deficiency     History reviewed. No pertinent surgical history.  There were no vitals filed for this visit.   Subjective Assessment - 09/20/17 1406    Subjective  Patient reports that she may have irritated her Lt shoulder ~ 5 months ago. She noticed pain and stiffness in the Lt shoulder into the arm and at times the wrist. She gradully noticed difficulty lifting arm up. Symptoms have continued with some days better than others.     Pertinent History  Was a Lt side sleeper but now only sleeps on the Rt side; used to carry most things with the Lt UE     Diagnostic tests  xrays     Patient Stated Goals  get Lt arm mobility back to be able to do her hair; get rid of the pain     Currently in Pain?  Yes    Pain Score  6     Pain Location  Shoulder    Pain Orientation  Left    Pain Descriptors / Indicators  Dull   sharp pain with sudden quick movements    Pain Type  Acute pain    Pain Radiating Towards  into the Lt arm and forearm - at times into the Lt wrist     Pain Onset  More than a month ago    Pain Frequency  Constant    Aggravating Factors   sudden movements; lifting arm overhead; lying on the Lt side moving in the morning after sleeping     Pain Relieving Factors  avoiding activities  that irritate the shoulder; regular movement          [facility] - 09/20/17 0001      Assessment   Medical Diagnosis  Lt adhesive capsulitis     Referring Provider  Dr Clementeen GrahamEvan Corey     Onset Date/Surgical Date  04/01/17    Hand Dominance  Right    Next MD Visit  none scheduled     Prior Therapy  none       Precautions   Precautions  None      Balance Screen   Has the patient fallen in the past 6 months  No    Has the patient had a decrease in activity level because of a fear of falling?   No    Is the patient reluctant to leave their home because of a fear of falling?   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     Leisure  household chores; Comptroller; painiting; remodeling; exercise 3 x/wk walking 25-30 min; some free weights       Observation/Other Assessments   Focus on Therapeutic Outcomes (FOTO)   40% limitation       Sensation   Additional Comments  WFL's per pt report       Posture/Postural Control   Posture/Postural Control  --   abnormal movement patterns Lt shd girdle w/ shd elevation    Posture Comments  head forward; shoulders rounded and elevated; head of the humerus anterior in orientation; scapulae abducted and rotated along the thoracic wall; scapulae abducted and rotated along the thoracic wall      AROM   Right/Left Shoulder  --   pain with all Lt shoulder motions greatest with rotation   Right Shoulder Extension  75 Degrees    Right Shoulder Flexion  157 Degrees    Right Shoulder ABduction  155 Degrees    Right Shoulder Internal Rotation  27 Degrees    Right Shoulder External Rotation  90 Degrees    Left Shoulder Extension  34 Degrees    Left Shoulder Flexion  98 Degrees    Left Shoulder ABduction  83 Degrees    Left Shoulder Internal Rotation   10 Degrees    Left Shoulder External Rotation  44 Degrees    Cervical Flexion  52    Cervical Extension  53    Cervical - Right Side Bend  33    Cervical - Left Side Bend  54    Cervical - Right Rotation  72    Cervical - Left Rotation  80      Strength   Overall Strength Comments  WNL's bilat UE's pain with resistive testing Lt shoulder       Palpation   Spinal mobility  hypomobile thoracic and lower cervical spine     Palpation comment  muscular tightness Lt > Rt pecs; upper trpa; leveator; teres; anterior deltiod                 Objective measurements completed on examination: See above findings.      [facility] Adult PT Treatment/Exercise - 09/20/17 0001      Therapeutic Activites    Therapeutic Activities  --   myofacial ball release work standing      Shoulder Exercises: Standing   Other Standing Exercises  scap squeeze 10 sec x 10; axial extension 10 sec x 5; L's x 10 with noodle       Shoulder Exercises: Pulleys   Flexion  --   10 sec hold x 10 reps    Flexion Limitations  note elevation of Lt shoudler girdle - PT assist/tactile and verbal cues to keep shoulder girdle down and back       Shoulder Exercises: Stretch   Other Shoulder Stretches  hands of counter - step back to stretch into shoulder flexion 10 sec x 3 reps       Moist Heat Therapy   Number Minutes Moist Heat  20 Minutes    Moist Heat Location  Shoulder   Lt      Electrical Stimulation   Electrical Stimulation Location  Lt shoulder     Electrical Stimulation Action  IFC    Electrical Stimulation Parameters  to tolerance    Electrical Stimulation Goals  Pain;Tone  PT Education - 09/20/17 1445    Education Details  HEP TENS    Person(s) Educated  Patient    Methods  Explanation;Demonstration;Tactile cues;Verbal cues;Handout    Comprehension  Verbalized understanding;Returned demonstration;Verbal cues required;Tactile cues required          PT Long Term Goals -  09/20/17 1554      PT LONG TERM GOAL #1   Title  Decrease Lt shoulder pain by 50-75% allowing patient to lift Lt UE to reach shelf at head height with minimal to no pain 11/01/17    Time  6    Period  Weeks    Status  New      PT LONG TERM GOAL #2   Title  Increase Lt shoulder AROM to =/> than Rt shoulder AROM 11/01/17    Time  6    Period  Weeks    Status  New      PT LONG TERM GOAL #3   Title  Patient reports return to full functional use of Lt UE 11/01/17    Time  6    Period  Weeks    Status  New      PT LONG TERM GOAL #4   Title  Improve posture and alignment through the thoracic spine and shoulder girdle with patient to demonstrate good upright porture to improve shoulder mechanics with Lt shoulder function 11/01/17    Time  6    Period  Weeks    Status  New      PT LONG TERM GOAL #5   Title  Independent in HEP 11/01/17    Time  6    Period  Weeks    Status  New      PT LONG TERM GOAL #6   Title  Improve FOTO to </= 26% limtiation 11/01/17    Time  6    Period  Weeks    Status  New             Plan - 09/20/17 1549    Clinical Impression Statement  Yolanda Kramer presents with 5+ month history of gradual onset of Lt shoulder pain and limited motion. Signs and symtpoms are consistent with adhesive capsulitis and shoulder dysfunctiion. She has poor posture and aligment; limited ROM; pain with active and passive movement; muscular tightness through the Lt upper quarter; limited functional activity using Lt UE. She will benefit from PT to address problems identified.     Clinical Presentation  Stable    Clinical Decision Making  Low    Rehab Potential  Good    PT Frequency  2x / week    PT Duration  6 weeks    PT Treatment/Interventions  Patient/family education;ADLs/Self Care Home Management;Cryotherapy;Electrical Stimulation;Iontophoresis 4mg /ml Dexamethasone;Moist Heat;Ultrasound;Dry needling;Manual techniques;Neuromuscular re-education;Therapeutic activities;Therapeutic  exercise    PT Next Visit Plan  review HEP; work on posture; add shd ext/IR/ER stretches; manual work and PROM Lt shoulder; modalities as indicated     Consulted and Agree with Plan of Care  Patient       Patient will benefit from skilled therapeutic intervention in order to improve the following deficits and impairments:  Postural dysfunction, Improper body mechanics, Pain, Increased fascial restricitons, Increased muscle spasms, Impaired UE functional use, Decreased mobility, Decreased range of motion, Decreased activity tolerance  Visit Diagnosis: Acute pain of left shoulder - Plan: PT plan of care cert/re-cert  Abnormal posture - Plan: PT plan of care cert/re-cert  Other symptoms and signs involving the musculoskeletal  system - Plan: PT plan of care cert/re-cert     Problem List Patient Active Problem List   Diagnosis Date Noted  . Hypothyroid   . Anxiety   . Tinea versicolor   . Vitamin D deficiency     Yolanda Kramer Rober Minion PT, MPH  09/20/2017, 4:15 PM  [facility] [address] [place], [place], [address] Phone: [phone]   Fax:  [phone]  Name: Yolanda Kramer MRN: [id number] Date of Birth: [birth year]

## 2017-09-20 NOTE — Patient Instructions (Signed)
Axial Extension (Chin Tuck)    Pull chin in and lengthen back of neck. Hold __5__ seconds while counting out loud. Repeat __10__ times. Do __several__ sessions per day.  Shoulder Blade Squeeze    Rotate shoulders back, then squeeze shoulder blades together. Repeat ____ times. Do ____ sessions per day.  Upper Back Strength: Lower Trapezius / Rotator Cuff " L's "     Arms in waitress pose, palms up. Press hands back and slide shoulder blades down. Hold for __5__ seconds. Repeat _10___ times. 1-2 times per day.    Pulley 10 sec x 10 reps  Over the door pulley  (may find pulley at [employer] [employer] in [place] or other medical supply store)  TENS UNIT: This is helpful for muscle pain and spasm.   Search and Purchase a TENS 7000 2nd edition at www.tenspros.com. It should be less than $30.     TENS unit instructions: Do not shower or bathe with the unit on Turn the unit off before removing electrodes or batteries If the electrodes lose stickiness add a drop of water to the electrodes after they are disconnected from the unit and place on plastic sheet. If you continued to have difficulty, call the TENS unit company to purchase more electrodes. Do not apply lotion on the skin area prior to use. Make sure the skin is clean and dry as this will help prolong the life of the electrodes. After use, always check skin for unusual red areas, rash or other skin difficulties. If there are any skin problems, does not apply electrodes to the same area. Never remove the electrodes from the unit by pulling the wires. Do not use the TENS unit or electrodes other than as directed. Do not change electrode placement without consultating your therapist or physician. Keep 2 fingers with between each electrode.     [facility] [address] [place], [place] [address]  [phone] (office) [phone] (fax)

## 2017-09-25 ENCOUNTER — Ambulatory Visit: Payer: BLUE CROSS/BLUE SHIELD | Admitting: Rehabilitative and Restorative Service Providers"

## 2017-09-25 ENCOUNTER — Encounter: Payer: Self-pay | Admitting: Rehabilitative and Restorative Service Providers"

## 2017-09-25 DIAGNOSIS — M25512 Pain in left shoulder: Secondary | ICD-10-CM

## 2017-09-25 DIAGNOSIS — R293 Abnormal posture: Secondary | ICD-10-CM | POA: Diagnosis not present

## 2017-09-25 DIAGNOSIS — R29898 Other symptoms and signs involving the musculoskeletal system: Secondary | ICD-10-CM

## 2017-09-25 NOTE — Therapy (Signed)
[facility] [place] [address] [place], [place], [address] Phone: [phone]   Fax:  [phone]  Physical Therapy Treatment  Patient Details  Name: Yolanda Kramer MRN: [id number] Date of Birth: 07/06/1970 Referring Provider: Dr Clementeen Graham    Encounter Date: 09/25/2017  PT End of Session - 09/25/17 0802    Visit Number  2    Number of Visits  12    Date for PT Re-Evaluation  11/01/17    PT Start Time  0802    PT Stop Time  0904    PT Time Calculation (min)  62 min    Activity Tolerance  Patient tolerated treatment well       Past Medical History:  Diagnosis Date  . Anxiety   . Hypothyroid   . Tinea versicolor   . Vitamin D deficiency     History reviewed. No pertinent surgical history.  There were no vitals filed for this visit.  Subjective Assessment - 09/25/17 0809    Subjective  Patient reports that she has a TENS unit and pulley for home. She has been using those and doing her exercises. She notices some improvement - no longer has the constant pain.     Currently in Pain?  Yes    Pain Score  4     Pain Location  Shoulder    Pain Orientation  Left    Pain Descriptors / Indicators  Dull;Aching                       [facility] Adult PT Treatment/Exercise - 09/25/17 0001      Shoulder Exercises: Pulleys   Flexion  --   10 sec hold x 10 reps    Scaption  --   10 sec hold x 10 reps      Shoulder Exercises: Stretch   Internal Rotation Stretch  5 reps   10 sec hold with strap    External Rotation Stretch  5 reps;10 seconds   sitting with cane elbow 90 deg and in at side    Other Shoulder Stretches  hands of counter - step back to stretch into shoulder flexion 10 sec x 3 reps     Other Shoulder Stretches  shoulder extension with cane 5 sec x 5; pulling cane to Rt across buttocks to begin IR 5 sec x5       Moist Heat Therapy   Number Minutes Moist Heat  20 Minutes    Moist Heat Location   Shoulder   Lt      Electrical Stimulation   Electrical Stimulation Location  Lt shoulder     Electrical Stimulation Action  IFC    Electrical Stimulation Parameters  to tolerance    Electrical Stimulation Goals  Pain;Tone      Manual Therapy   Manual therapy comments  pt supine     Joint Mobilization  Lt GH joint circumduction/distraction    Soft tissue mobilization  deep tissue work through the [employer]; teres; upper trap; leveator    Myofascial Release  anterior shoulder     Scapular Mobilization  Lt     Passive ROM  Lt shoulder into flexioin; ER/IR in scapular plane              PT Education - 09/25/17 0835    Education Details  HEP     Person(s) Educated  Patient    Methods  Explanation;Demonstration;Tactile cues;Handout;Verbal cues  Comprehension  Verbalized understanding;Returned demonstration;Verbal cues required;Tactile cues required          PT Long Term Goals - 09/20/17 1554      PT LONG TERM GOAL #1   Title  Decrease Lt shoulder pain by 50-75% allowing patient to lift Lt UE to reach shelf at head height with minimal to no pain 11/01/17    Time  6    Period  Weeks    Status  New      PT LONG TERM GOAL #2   Title  Increase Lt shoulder AROM to =/> than Rt shoulder AROM 11/01/17    Time  6    Period  Weeks    Status  New      PT LONG TERM GOAL #3   Title  Patient reports return to full functional use of Lt UE 11/01/17    Time  6    Period  Weeks    Status  New      PT LONG TERM GOAL #4   Title  Improve posture and alignment through the thoracic spine and shoulder girdle with patient to demonstrate good upright porture to improve shoulder mechanics with Lt shoulder function 11/01/17    Time  6    Period  Weeks    Status  New      PT LONG TERM GOAL #5   Title  Independent in HEP 11/01/17    Time  6    Period  Weeks    Status  New      PT LONG TERM GOAL #6   Title  Improve FOTO to </= 26% limtiation 11/01/17    Time  6    Period  Weeks    Status   New            Plan - 09/25/17 1191    Clinical Impression Statement  Some improvement reported with initial HEP and TENS unit. Added exercises without difficulty. Tolerated manual work and PROM well. Progressing well toward stated goals of therapy.     Rehab Potential  Good    PT Frequency  2x / week    PT Duration  6 weeks    PT Treatment/Interventions  Patient/family education;ADLs/Self Care Home Management;Cryotherapy;Electrical Stimulation;Iontophoresis 4mg /ml Dexamethasone;Moist Heat;Ultrasound;Dry needling;Manual techniques;Neuromuscular re-education;Therapeutic activities;Therapeutic exercise    PT Next Visit Plan  review HEP; work on posture; add shd ext/IR/ER stretches; manual work and PROM Lt shoulder; modalities as indicated     Consulted and Agree with Plan of Care  Patient       Patient will benefit from skilled therapeutic intervention in order to improve the following deficits and impairments:  Postural dysfunction, Improper body mechanics, Pain, Increased fascial restricitons, Increased muscle spasms, Impaired UE functional use, Decreased mobility, Decreased range of motion, Decreased activity tolerance  Visit Diagnosis: Acute pain of left shoulder  Abnormal posture  Other symptoms and signs involving the musculoskeletal system     Problem List Patient Active Problem List   Diagnosis Date Noted  . Hypothyroid   . Anxiety   . Tinea versicolor   . Vitamin D deficiency     Yolanda Kramer PT, MPH  09/25/2017, 9:58 AM  [facility] [address] [place], [place], [address] Phone: [phone]   Fax:  [phone]  Name: Yolanda Kramer MRN: [id number] Date of Birth: [birth year]

## 2017-09-25 NOTE — Patient Instructions (Addendum)
External Rotator Cuff Stretch, standing     Lie supine, one elbow against ribs, and bent at 90, dowel in palm, other hand holding dowel up. Use other arm to push forearm toward floor. Keep elbow against side. Hold __5-10_ seconds. Repeat _5__ times per session. Do _2-3__ sessions per day.   External Rotator Cuff Stretch, Sitting (Passive)    Sit with elbow bent, forearm on table, palm down. Bend forward at waist until a stretch is felt in shoulder. Hold _10__ seconds.  Repeat _5-10__ times per session. Do _2-3__ sessions per day.   Anterior Capsule Stretch, Standing    Stand holding stick behind back, hands palms up. Lift stick away from body as far as possible. Hold _5-10__ seconds. Repeat _5_ times per session. Do _2-3__ sessions per day.  Can pull stick across body same reps as above   Internal Rotator Cuff Stretch, Standing    Stand holding club behind body, one arm above head, other arm bent behind back. With upper hand, pull gently upward. Hold _5-10__ seconds. Repeat __3-5_ times per session. Do __2-3_ sessions per day.

## 2017-09-27 ENCOUNTER — Ambulatory Visit: Payer: BLUE CROSS/BLUE SHIELD | Admitting: Physical Therapy

## 2017-09-27 DIAGNOSIS — R29898 Other symptoms and signs involving the musculoskeletal system: Secondary | ICD-10-CM | POA: Diagnosis not present

## 2017-09-27 DIAGNOSIS — R293 Abnormal posture: Secondary | ICD-10-CM | POA: Diagnosis not present

## 2017-09-27 DIAGNOSIS — M25512 Pain in left shoulder: Secondary | ICD-10-CM

## 2017-09-27 NOTE — Patient Instructions (Addendum)
Kinesiology tape What is kinesiology tape?  There are many brands of kinesiology tape.  KTape, [employer], [employer], Dynamic tape, to name a few. It is an elasticized tape designed to support the body's natural healing process. This tape provides stability and support to muscles and joints without restricting motion. It can also help decrease swelling in the area of application. How does it work? The tape microscopically lifts and decompresses the skin to allow for drainage of lymph (swelling) to flow away from area, reducing inflammation.  The tape has the ability to help re-educate the neuromuscular system by targeting specific receptors in the skin.  The presence of the tape increases the body's awareness of posture and body mechanics.  Do not use with: . Open wounds . Skin lesions . Adhesive allergies Safe removal of the tape: In some rare cases, mild/moderate skin irritation can occur.  This can include redness, itchiness, or hives. If this occurs, immediately remove tape and consult your primary care physician if symptoms are severe or do not resolve within 2 days.  To remove tape safely, hold nearby skin with one hand and gentle roll tape down with other hand.  You can apply oil or conditioner to tape while in shower prior to removal to loosen adhesive.  DO NOT swiftly rip tape off like a band-aid, as this could cause skin tears and additional skin irritation.   Gilmer Mor Exercise: Flexion    Lie on back, holding cane above chest. Keeping arms as straight as possible, lower cane toward floor beyond head. Hold _5___ seconds. Repeat __5-10__ times. Do __1__ sessions per day.

## 2017-09-27 NOTE — Therapy (Signed)
[facility] [place] [address] [address] [place], [place], [address] Phone: [phone]   Fax:  [phone]  Physical Therapy Treatment  Patient Details  Name: Yolanda Kramer MRN: [id number] Date of Birth: September 29, 1970 Referring Provider (PT): Dr Clementeen Graham    Encounter Date: 09/27/2017  PT End of Session - 09/27/17 0853    Visit Number  3    Number of Visits  12    Date for PT Re-Evaluation  11/01/17    PT Start Time  0848    PT Stop Time  0945    PT Time Calculation (min)  57 min    Behavior During Therapy  [facility] for tasks assessed/performed       Past Medical History:  Diagnosis Date  . Anxiety   . Hypothyroid   . Tinea versicolor   . Vitamin D deficiency     No past surgical history on file.  There were no vitals filed for this visit.  Subjective Assessment - 09/27/17 0853    Subjective  "I'm sore."  Pt reports that she feels the new exercises are causing increased soreness.  TENS and heat have helped some. Now sleeping 5-6 hrs, vs 3-4.      Patient Stated Goals  get Lt arm mobility back to be able to do her hair; get rid of the pain     Currently in Pain?  Yes    Pain Score  5     Pain Location  Shoulder    Pain Orientation  Left    Pain Descriptors / Indicators  Dull    Pain Radiating Towards  into Lt arm and forearm    Aggravating Factors   sudden movement; lifting arm overhead; lying on Lt side moving    Pain Relieving Factors  TENS, heat, avoiding activities that irritate shoulder         [facility] PT Assessment - 09/27/17 0001      Assessment   Medical Diagnosis  Lt adhesive capsulitis     Referring Provider (PT)  Dr Clementeen Graham     Onset Date/Surgical Date  04/01/17    Hand Dominance  Right       [facility] Adult PT Treatment/Exercise - 09/27/17 0001      Exercises   Exercises  Shoulder      Shoulder Exercises: Supine   Flexion  AAROM;Both;10 reps   with cane; up to 115 deg     Shoulder Exercises: Pulleys    Flexion  --   10 sec hold x 10 reps    Scaption  --   10 sec hold x 10 reps      Shoulder Exercises: Stretch   Internal Rotation Stretch  --   10 reps; 5 with cane/ 5 with strap   Other Shoulder Stretches  hands of counter - step back to stretch into shoulder flexion 10 sec x 5 reps, Lt bicep stretch with hand holding counter     Other Shoulder Stretches  shoulder extension with cane 5 sec x 10; pulling cane to Rt across buttocks to begin IR 5 sec x 2       Moist Heat Therapy   Number Minutes Moist Heat  20 Minutes    Moist Heat Location  Shoulder   Lt      Electrical Stimulation   Electrical Stimulation Location  Lt shoulder     Electrical Stimulation Action  IFC    Electrical Stimulation Parameters  to tolerance  Electrical Stimulation Goals  Pain;Tone      Manual Therapy   Manual Therapy  Taping;Myofascial release;Soft tissue mobilization    Manual therapy comments  pt supine     Soft tissue mobilization  STM to Lt subscap, lat, deltoid, forearm to tricep     Myofascial Release  Lt pec, lat.     Passive ROM  Lt shoulder ER to tolerance    Kinesiotex  IT consultant  I strip of sensitive skin Rock tape applied to Lt wrist, up lateral forearm to lateral mid tricep to decompress tissue, increase proprioception and decrease pain.               PT Education - 09/27/17 0931    Education Details  HEP, kinesiotape info    Person(s) Educated  Patient    Methods  Explanation;Handout;Verbal cues;Demonstration    Comprehension  Verbalized understanding;Returned demonstration          PT Long Term Goals - 09/20/17 1554      PT LONG TERM GOAL #1   Title  Decrease Lt shoulder pain by 50-75% allowing patient to lift Lt UE to reach shelf at head height with minimal to no pain 11/01/17    Time  6    Period  Weeks    Status  New      PT LONG TERM GOAL #2   Title  Increase Lt shoulder AROM to =/> than Rt shoulder AROM 11/01/17    Time  6     Period  Weeks    Status  New      PT LONG TERM GOAL #3   Title  Patient reports return to full functional use of Lt UE 11/01/17    Time  6    Period  Weeks    Status  New      PT LONG TERM GOAL #4   Title  Improve posture and alignment through the thoracic spine and shoulder girdle with patient to demonstrate good upright porture to improve shoulder mechanics with Lt shoulder function 11/01/17    Time  6    Period  Weeks    Status  New      PT LONG TERM GOAL #5   Title  Independent in HEP 11/01/17    Time  6    Period  Weeks    Status  New      PT LONG TERM GOAL #6   Title  Improve FOTO to </= 26% limtiation 11/01/17    Time  6    Period  Weeks    Status  New            Plan - 09/27/17 0941    Clinical Impression Statement  Pt reporting increased soreness since starting exercises and when completing them during session.  Pt shown variations on Lt shoulder IR stretch.  Trial of ktape applied to Lt arm to help reduce pain.  Pt progressing towards goals.     Rehab Potential  Good    PT Frequency  2x / week    PT Duration  6 weeks    PT Treatment/Interventions  Patient/family education;ADLs/Self Care Home Management;Cryotherapy;Electrical Stimulation;Iontophoresis 4mg /ml Dexamethasone;Moist Heat;Ultrasound;Dry needling;Manual techniques;Neuromuscular re-education;Therapeutic activities;Therapeutic exercise    PT Next Visit Plan  assess response to tape, IASTM to tricep/ shoulder, manual including PROM to shoulder.     Consulted and Agree with Plan of Care  Patient  Patient will benefit from skilled therapeutic intervention in order to improve the following deficits and impairments:  Postural dysfunction, Improper body mechanics, Pain, Increased fascial restricitons, Increased muscle spasms, Impaired UE functional use, Decreased mobility, Decreased range of motion, Decreased activity tolerance  Visit Diagnosis: Acute pain of left shoulder  Abnormal posture  Other  symptoms and signs involving the musculoskeletal system     Problem List Patient Active Problem List   Diagnosis Date Noted  . Hypothyroid   . Anxiety   . Tinea versicolor   . Vitamin D deficiency    Mayer Camel, PTA 09/27/17 11:47 AM  [facility] [place] [address] [place], [place], [address] Phone: [phone]   Fax:  [phone]  Name: Yolanda Kramer MRN: [id number] Date of Birth: 1970-01-17

## 2017-10-02 ENCOUNTER — Encounter: Payer: BLUE CROSS/BLUE SHIELD | Admitting: Physical Therapy

## 2017-10-04 ENCOUNTER — Ambulatory Visit (INDEPENDENT_AMBULATORY_CARE_PROVIDER_SITE_OTHER): Payer: BLUE CROSS/BLUE SHIELD | Admitting: Physical Therapy

## 2017-10-04 DIAGNOSIS — R293 Abnormal posture: Secondary | ICD-10-CM | POA: Diagnosis not present

## 2017-10-04 DIAGNOSIS — R29898 Other symptoms and signs involving the musculoskeletal system: Secondary | ICD-10-CM

## 2017-10-04 DIAGNOSIS — M25512 Pain in left shoulder: Secondary | ICD-10-CM

## 2017-10-04 NOTE — Therapy (Signed)
[facility] [place] [address] [place] [address] [place], [place], [address] Phone: [phone]   Fax:  [phone]  Physical Therapy Treatment  Patient Details  Name: Yolanda Kramer MRN: [id number] Date of Birth: October 29, 1970 Referring Provider (PT): Dr Clementeen Graham    Encounter Date: 10/04/2017  PT End of Session - 10/04/17 1453    Visit Number  4    Number of Visits  12    Date for PT Re-Evaluation  11/01/17    PT Start Time  1449    PT Stop Time  1546    PT Time Calculation (min)  57 min    Activity Tolerance  Patient tolerated treatment well    Behavior During Therapy  [facility] for tasks assessed/performed       Past Medical History:  Diagnosis Date  . Anxiety   . Hypothyroid   . Tinea versicolor   . Vitamin D deficiency     No past surgical history on file.  There were no vitals filed for this visit.  Subjective Assessment - 10/04/17 1453    Subjective  Pt reports she slept the best she had in a long time, 7 hrs.  She feels the tape helped decreased the pain in forearm.     Currently in Pain?  Yes    Pain Score  5     Pain Location  Arm    Pain Orientation  Left    Pain Descriptors / Indicators  Aching;Tightness    Pain Radiating Towards  into shoulder and forearm    Aggravating Factors   lifting arm overhead,lying on Lt side    Pain Relieving Factors  TENS, heat, avoiding activities that irritate shoulder         [facility] PT Assessment - 10/04/17 0001      Assessment   Medical Diagnosis  Lt adhesive capsulitis    Referring Provider (PT)  Dr Clementeen Graham     Onset Date/Surgical Date  04/01/17    Hand Dominance  Right    Next MD Visit  after therapy      AROM   Left Shoulder Flexion  103 Degrees    Left Shoulder ABduction  100 Degrees    Left Shoulder Internal Rotation  15 Degrees   supine, abdct ~80 deg   Left Shoulder External Rotation  30 Degrees   supine, abdct 80 deg       [facility] Adult PT Treatment/Exercise -  10/04/17 0001      Exercises   Exercises  Shoulder      Shoulder Exercises: Supine   External Rotation  AAROM;Left;10 reps   cane, to tolerance   Other Supine Exercises  AAROM hands to forehead with elbows out, assist from RUE x 5 reps      Shoulder Exercises: Standing   Internal Rotation  AAROM;Both;10 reps   cane, moving side to side   Extension  AAROM;Both;10 reps   cane   Other Standing Exercises  wall ladder with LUE x 5 reps, up to #23    Other Standing Exercises  shoulder rolls x 10 each direction       Shoulder Exercises: Pulleys   Flexion  --   10 sec hold x 10 reps    Scaption  --   10 sec hold x 10 reps      Shoulder Exercises: Stretch   Other Shoulder Stretches  lat stretch, with hands of counter - step back to stretch into shoulder flexion 10 sec  x 3 reps, Lt bicep stretch with hand holding counter x 15 sec x 3 reps       Moist Heat Therapy   Number Minutes Moist Heat  15 Minutes    Moist Heat Location  Elbow;Shoulder      Electrical Stimulation   Electrical Stimulation Location  Lt shoulder and forearm    Electrical Stimulation Action  IFC    Electrical Stimulation Parameters   to tolerance    Electrical Stimulation Goals  Pain;Tone      Manual Therapy   Manual therapy comments  pt supine     Soft tissue mobilization  IASTM to Lt lateral forearm, distal and prox bicep to decrease fascial tightness and improve ROM       Kinesiotix   Create Space  I strip of Reg rock tape applied from Lt wrist up lateral forearm to prox bicep to decompress tissue and decrease pain.                   PT Long Term Goals - 09/20/17 1554      PT LONG TERM GOAL #1   Title  Decrease Lt shoulder pain by 50-75% allowing patient to lift Lt UE to reach shelf at head height with minimal to no pain 11/01/17    Time  6    Period  Weeks    Status  New      PT LONG TERM GOAL #2   Title  Increase Lt shoulder AROM to =/> than Rt shoulder AROM 11/01/17    Time  6    Period   Weeks    Status  New      PT LONG TERM GOAL #3   Title  Patient reports return to full functional use of Lt UE 11/01/17    Time  6    Period  Weeks    Status  New      PT LONG TERM GOAL #4   Title  Improve posture and alignment through the thoracic spine and shoulder girdle with patient to demonstrate good upright porture to improve shoulder mechanics with Lt shoulder function 11/01/17    Time  6    Period  Weeks    Status  New      PT LONG TERM GOAL #5   Title  Independent in HEP 11/01/17    Time  6    Period  Weeks    Status  New      PT LONG TERM GOAL #6   Title  Improve FOTO to </= 26% limtiation 11/01/17    Time  6    Period  Weeks    Status  New            Plan - 10/04/17 1638    Clinical Impression Statement  Pt making slow gains with ROM in Lt shoulder.  Pt's sleep has improved since starting therapy; slept 7 hrs last night for first time in months.  Pt had positive response to ktape; reapplied today.  Progressing towards goals.     Rehab Potential  Good    PT Frequency  2x / week    PT Duration  6 weeks    PT Treatment/Interventions  Patient/family education;ADLs/Self Care Home Management;Cryotherapy;Electrical Stimulation;Iontophoresis 4mg /ml Dexamethasone;Moist Heat;Ultrasound;Dry needling;Manual techniques;Neuromuscular re-education;Therapeutic activities;Therapeutic exercise    PT Next Visit Plan  assess response to reg tape, IASTM to tricep/ shoulder, manual including PROM to shoulder.     Consulted and Agree with Plan of Care  Patient       Patient will benefit from skilled therapeutic intervention in order to improve the following deficits and impairments:  Postural dysfunction, Improper body mechanics, Pain, Increased fascial restricitons, Increased muscle spasms, Impaired UE functional use, Decreased mobility, Decreased range of motion, Decreased activity tolerance  Visit Diagnosis: Acute pain of left shoulder  Abnormal posture  Other symptoms and  signs involving the musculoskeletal system     Problem List Patient Active Problem List   Diagnosis Date Noted  . Hypothyroid   . Anxiety   . Tinea versicolor   . Vitamin D deficiency    Mayer Camel, PTA 10/04/17 4:40 PM  [facility] [place] [address] [place], [place], [address] Phone: [phone]   Fax:  [phone]  Name: Yolanda Kramer MRN: [id number] Date of Birth: 1970-02-11

## 2017-10-09 ENCOUNTER — Ambulatory Visit (INDEPENDENT_AMBULATORY_CARE_PROVIDER_SITE_OTHER): Payer: BLUE CROSS/BLUE SHIELD | Admitting: Physical Therapy

## 2017-10-09 DIAGNOSIS — M25512 Pain in left shoulder: Secondary | ICD-10-CM

## 2017-10-09 DIAGNOSIS — R29898 Other symptoms and signs involving the musculoskeletal system: Secondary | ICD-10-CM

## 2017-10-09 DIAGNOSIS — R293 Abnormal posture: Secondary | ICD-10-CM | POA: Diagnosis not present

## 2017-10-09 NOTE — Therapy (Signed)
[facility] [place] [address] [place], [place], [address] Phone: [phone]   Fax:  [phone]  Physical Therapy Treatment  Patient Details  Name: ASPIN PALOMAREZ MRN: [id number] Date of Birth: 1970/03/26 Referring Provider (PT): Dr Clementeen Graham    Encounter Date: 10/09/2017  PT End of Session - 10/09/17 1609    Visit Number  5    Number of Visits  12    Date for PT Re-Evaluation  11/01/17    PT Start Time  1602    PT Stop Time  1655    PT Time Calculation (min)  53 min    Behavior During Therapy  [facility] for tasks assessed/performed       Past Medical History:  Diagnosis Date  . Anxiety   . Hypothyroid   . Tinea versicolor   . Vitamin D deficiency     No past surgical history on file.  There were no vitals filed for this visit.  Subjective Assessment - 10/09/17 1611    Subjective  Pt reports the stretches are slow going.  Feels like she's not making much headway.  Sleep is still better than it had been.      Patient Stated Goals  get Lt arm mobility back to be able to do her hair; get rid of the pain     Currently in Pain?  Yes    Pain Score  4     Pain Location  Shoulder    Pain Orientation  Left    Pain Descriptors / Indicators  Aching;Tightness    Aggravating Factors   lifting arm overhead     Pain Relieving Factors  TENS, heat          [facility] PT Assessment - 10/09/17 0001      ROM / Strength   AROM / PROM / Strength  PROM      AROM   Left Shoulder Flexion  108 Degrees    Left Shoulder External Rotation  30 Degrees   supine, abdct 80 deg     PROM   PROM Assessment Site  Shoulder    Right/Left Shoulder  Left    Left Shoulder External Rotation  45 Degrees   scaption of 70 deg      [facility] Adult PT Treatment/Exercise - 10/09/17 0001      Shoulder Exercises: Standing   Internal Rotation  AAROM;Both;10 reps   cane, moving side to side   Extension  AAROM;Both;10 reps   cane     Shoulder Exercises:  Pulleys   Flexion  --   10 sec hold x 10 reps    Scaption  --   10 sec hold x 10 reps      Shoulder Exercises: Stretch   Other Shoulder Stretches  hands on counter, walking backward to stretch lats    Other Shoulder Stretches  low load long duration stretch with 1# weight in hand (for ER stretch) x 1 min, 2 reps;  sleeper stretch IR/ ER for LUE x 30 sec x 2 reps each direction       Moist Heat Therapy   Number Minutes Moist Heat  15 Minutes    Moist Heat Location  Elbow;Shoulder      Electrical Stimulation   Electrical Stimulation Location  Lt shoulder and forearm    Electrical Stimulation Action  IFC    Electrical Stimulation Parameters  to tole      Manual Therapy   Manual Therapy  Soft  tissue mobilization;Passive ROM    Manual therapy comments  pt seated, then supine    Joint Mobilization  Lt GH joint circumduction/distraction    Soft tissue mobilization  IASTM to Lt posterior shoulder (periscapular musculature) to decrease fascial tightness and improve ROM;  STM to Lt triep;  pin and stretch to teres maj/minor    Myofascial Release  Lt pec release     Passive ROM  Lt shoulder flexion, scaption, ER to tolerance              PT Education - 10/09/17 1647    Education Details  DN info    Person(s) Educated  Patient    Methods  Explanation;Handout    Comprehension  Verbalized understanding          PT Long Term Goals - 09/20/17 1554      PT LONG TERM GOAL #1   Title  Decrease Lt shoulder pain by 50-75% allowing patient to lift Lt UE to reach shelf at head height with minimal to no pain 11/01/17    Time  6    Period  Weeks    Status  New      PT LONG TERM GOAL #2   Title  Increase Lt shoulder AROM to =/> than Rt shoulder AROM 11/01/17    Time  6    Period  Weeks    Status  New      PT LONG TERM GOAL #3   Title  Patient reports return to full functional use of Lt UE 11/01/17    Time  6    Period  Weeks    Status  New      PT LONG TERM GOAL #4   Title   Improve posture and alignment through the thoracic spine and shoulder girdle with patient to demonstrate good upright porture to improve shoulder mechanics with Lt shoulder function 11/01/17    Time  6    Period  Weeks    Status  New      PT LONG TERM GOAL #5   Title  Independent in HEP 11/01/17    Time  6    Period  Weeks    Status  New      PT LONG TERM GOAL #6   Title  Improve FOTO to </= 26% limtiation 11/01/17    Time  6    Period  Weeks    Status  New            Plan - 10/09/17 1647    Clinical Impression Statement  Gradual improvement in tissue extensibility of Lt shoulder.  Pt continues to be limited in all directions with LUE.  She tolerated tape well to forearm, however skin appeared irritated from her scratching residual adhesive off area (held tape today). Pt will benefit from continued PT intervention to maximize functional mobility.     Rehab Potential  Good    PT Frequency  2x / week    PT Duration  6 weeks    PT Treatment/Interventions  Patient/family education;ADLs/Self Care Home Management;Cryotherapy;Electrical Stimulation;Iontophoresis 4mg /ml Dexamethasone;Moist Heat;Ultrasound;Dry needling;Manual techniques;Neuromuscular re-education;Therapeutic activities;Therapeutic exercise    PT Next Visit Plan  manual therapy to Lt shoulder (possible DN).  continued focus on ROM.     Consulted and Agree with Plan of Care  Patient       Patient will benefit from skilled therapeutic intervention in order to improve the following deficits and impairments:  Postural dysfunction, Improper body mechanics, Pain, Increased  fascial restricitons, Increased muscle spasms, Impaired UE functional use, Decreased mobility, Decreased range of motion, Decreased activity tolerance  Visit Diagnosis: Acute pain of left shoulder  Abnormal posture  Other symptoms and signs involving the musculoskeletal system     Problem List Patient Active Problem List   Diagnosis Date Noted  .  Hypothyroid   . Anxiety   . Tinea versicolor   . Vitamin D deficiency    Mayer Camel, PTA 10/09/17 4:53 PM  [facility] [place] [address] [place], [place], [address] Phone: [phone]   Fax:  [phone]  Name: BRAELEE HERRLE MRN: [id number] Date of Birth: [birth year]

## 2017-10-09 NOTE — Patient Instructions (Signed)

## 2017-10-11 ENCOUNTER — Encounter: Payer: Self-pay | Admitting: Rehabilitative and Restorative Service Providers"

## 2017-10-11 ENCOUNTER — Ambulatory Visit (INDEPENDENT_AMBULATORY_CARE_PROVIDER_SITE_OTHER): Payer: BLUE CROSS/BLUE SHIELD | Admitting: Rehabilitative and Restorative Service Providers"

## 2017-10-11 DIAGNOSIS — M25512 Pain in left shoulder: Secondary | ICD-10-CM

## 2017-10-11 DIAGNOSIS — R293 Abnormal posture: Secondary | ICD-10-CM

## 2017-10-11 DIAGNOSIS — R29898 Other symptoms and signs involving the musculoskeletal system: Secondary | ICD-10-CM | POA: Diagnosis not present

## 2017-10-11 NOTE — Therapy (Signed)
[facility] [place] [address] [place], [place], [address] Phone: [phone]   Fax:  [phone]  Physical Therapy Treatment  Patient Details  Name: Yolanda Kramer MRN: [id number] Date of Birth: Sep 14, 1970 Referring Provider (PT): Dr Clementeen Graham    Encounter Date: 10/11/2017  PT End of Session - 10/11/17 1537    Visit Number  6    Number of Visits  12    Date for PT Re-Evaluation  11/01/17    PT Start Time  1530       Past Medical History:  Diagnosis Date  . Anxiety   . Hypothyroid   . Tinea versicolor   . Vitamin D deficiency     History reviewed. No pertinent surgical history.  There were no vitals filed for this visit.  Subjective Assessment - 10/11/17 1538    Subjective  Continued improvement - can tell she is using arm for more functional activities and she is sleeping better.     Currently in Pain?  Yes    Pain Score  2     Pain Location  Shoulder    Pain Orientation  Left    Pain Descriptors / Indicators  Aching;Tightness    Pain Type  Acute pain    Pain Radiating Towards  into shoulder and forearm     Pain Onset  More than a month ago    Pain Frequency  Intermittent                       [facility] Adult PT Treatment/Exercise - 10/11/17 0001      Shoulder Exercises: Standing   Other Standing Exercises  with noodle       Shoulder Exercises: Pulleys   Flexion  --   10 sec hold x 10 reps    Scaption  --   10 sec hold x 10 reps      Shoulder Exercises: Stretch   Other Shoulder Stretches  hands on counter, walking backward     Other Shoulder Stretches  hands behind head 30 sec x 2 PT assist to hold elbow - relax shoulder musculature      Moist Heat Therapy   Number Minutes Moist Heat  15 Minutes    Moist Heat Location  Shoulder   thoracic spine      Electrical Stimulation   Electrical Stimulation Location  Lt shoulder; scapular musculature     Electrical Stimulation Action  IFC     Electrical Stimulation Parameters  to tolerance    Electrical Stimulation Goals  Pain;Tone      Manual Therapy   Manual therapy comments  pt supine    Joint Mobilization  Lt GH joint circumduction/distraction    Soft tissue mobilization  soft tissue work through the Lt shoulder girdle musculature    Myofascial Release  anterior Lt shoulder    Scapular Mobilization  Lt     Passive ROM  Lt shoulder flexion, scaption, ER IR, extension, horizontal abduction              PT Education - 10/11/17 1622    Education Details  HEP     Person(s) Educated  Patient    Methods  Explanation;Demonstration;Tactile cues;Verbal cues;Handout    Comprehension  Verbalized understanding;Returned demonstration;Verbal cues required;Tactile cues required          PT Long Term Goals - 09/20/17 1554      PT LONG TERM GOAL #1   Title  Decrease Lt shoulder pain by 50-75% allowing patient to lift Lt UE to reach shelf at head height with minimal to no pain 11/01/17    Time  6    Period  Weeks    Status  New      PT LONG TERM GOAL #2   Title  Increase Lt shoulder AROM to =/> than Rt shoulder AROM 11/01/17    Time  6    Period  Weeks    Status  New      PT LONG TERM GOAL #3   Title  Patient reports return to full functional use of Lt UE 11/01/17    Time  6    Period  Weeks    Status  New      PT LONG TERM GOAL #4   Title  Improve posture and alignment through the thoracic spine and shoulder girdle with patient to demonstrate good upright porture to improve shoulder mechanics with Lt shoulder function 11/01/17    Time  6    Period  Weeks    Status  New      PT LONG TERM GOAL #5   Title  Independent in HEP 11/01/17    Time  6    Period  Weeks    Status  New      PT LONG TERM GOAL #6   Title  Improve FOTO to </= 26% limtiation 11/01/17    Time  6    Period  Weeks    Status  New            Plan - 10/11/17 1538    Clinical Impression Statement  Continued gradual improvement with  patient reporting that she no longer has constant pain. She is using her Lt UE for more functional activities and is sleeping better. She continues to have significant joint/capsular tightness Lt GH joint and decreased disassociation of scapula to humerus Lt shoulder girdle as well as muscular tightness in Lt upper quarter. patient will benefit from PT to address continued problems and restore normal movement and function Lt UE.     Rehab Potential  Good    PT Frequency  2x / week    PT Duration  6 weeks    PT Treatment/Interventions  Patient/family education;ADLs/Self Care Home Management;Cryotherapy;Electrical Stimulation;Iontophoresis 4mg /ml Dexamethasone;Moist Heat;Ultrasound;Dry needling;Manual techniques;Neuromuscular re-education;Therapeutic activities;Therapeutic exercise    PT Next Visit Plan  manual therapy to Lt shoulder (possible DN).  continued focus on ROM.     Consulted and Agree with Plan of Care  Patient       Patient will benefit from skilled therapeutic intervention in order to improve the following deficits and impairments:  Postural dysfunction, Improper body mechanics, Pain, Increased fascial restricitons, Increased muscle spasms, Impaired UE functional use, Decreased mobility, Decreased range of motion, Decreased activity tolerance  Visit Diagnosis: Acute pain of left shoulder  Abnormal posture  Other symptoms and signs involving the musculoskeletal system     Problem List Patient Active Problem List   Diagnosis Date Noted  . Hypothyroid   . Anxiety   . Tinea versicolor   . Vitamin D deficiency     Adreyan Carbajal Rober Minion PT, MPH  10/11/2017, 4:27 PM  [facility] [address] [place] [place], [place], [address] Phone: [phone]   Fax:  [phone]  Name: Yolanda Kramer MRN: [id number] Date of Birth: 09/05/1970

## 2017-10-11 NOTE — Patient Instructions (Addendum)
External Rotator Cuff Stretch, Supine support Left elbow on pillow to release the work through the left pecs/biceps     Lie supine, fingers clasped behind head, elbows close together. Pull elbows backward while pinching shoulder blades. Hold _20-40_ seconds. Repeat _2-3__ times per session. Do __2_ sessions per day.

## 2017-10-16 ENCOUNTER — Ambulatory Visit (INDEPENDENT_AMBULATORY_CARE_PROVIDER_SITE_OTHER): Payer: BLUE CROSS/BLUE SHIELD | Admitting: Physical Therapy

## 2017-10-16 ENCOUNTER — Encounter: Payer: Self-pay | Admitting: Physical Therapy

## 2017-10-16 DIAGNOSIS — M25512 Pain in left shoulder: Secondary | ICD-10-CM | POA: Diagnosis not present

## 2017-10-16 DIAGNOSIS — R29898 Other symptoms and signs involving the musculoskeletal system: Secondary | ICD-10-CM | POA: Diagnosis not present

## 2017-10-16 DIAGNOSIS — R293 Abnormal posture: Secondary | ICD-10-CM | POA: Diagnosis not present

## 2017-10-16 NOTE — Therapy (Addendum)
[place], [place], [address] Phone: [phone]   Fax:  [phone]  Physical Therapy Treatment  Patient Details  Name: Yolanda Kramer MRN: [id number] Date of Birth: Mar 10, 1970 Referring Provider (PT): Dr Lynne Leader    Encounter Date: 10/16/2017  PT End of Session - 10/16/17 1408    Visit Number  7    Number of Visits  12    Date for PT Re-Evaluation  11/01/17    PT Start Time  1401    PT Stop Time  1454    PT Time Calculation (min)  53 min       Past Medical History:  Diagnosis Date  . Anxiety   . Hypothyroid   . Tinea versicolor   . Vitamin D deficiency     History reviewed. No pertinent surgical history.  There were no vitals filed for this visit.  Subjective Assessment - 10/16/17 1408    Subjective  Pt reports she was really sore for about a day after last session, and it loosened up after that.  She is noticing small improvements each day, ie: reaching behind to get coat on, showering.     Currently in Pain?  Yes    Pain Score  3     Pain Location  Shoulder    Pain Orientation  Left    Pain Descriptors / Indicators  Aching;Tightness    Aggravating Factors   lifting arm overhead    Pain Relieving Factors  TENS, heat          [facility] PT Assessment - 10/16/17 0001      AROM   Left Shoulder Flexion  108 Degrees    Left Shoulder ABduction  102 Degrees   scaption, some substitution    Left Shoulder External Rotation  53 Degrees   supine, abdct 80 deg       [facility] Adult PT Treatment/Exercise - 10/16/17 0001      Exercises   Exercises  Shoulder      Shoulder Exercises: Supine   Other Supine Exercises  AAROM hands to forehead with elbows out, assist from RUE x 5 reps      Shoulder Exercises: Standing   Internal Rotation  AAROM;Both;10 reps   cane, up and over buttocks (LUE limited)   Other Standing Exercises  wall ladder with LUE x 5 reps, up to #24      Shoulder  Exercises: Therapy Ball   Flexion  Left;5 reps    Flexion Limitations  walking orange ball up wall and stepping forward for stretch      Moist Heat Therapy   Number Minutes Moist Heat  20 Minutes    Moist Heat Location  Shoulder   Lt     Electrical Stimulation   Electrical Stimulation Location  Lt shoulder; scapular musculature     Electrical Stimulation Action  IFC    Electrical Stimulation Parameters  to tolerance    Electrical Stimulation Goals  Pain;Tone      Manual Therapy   Manual therapy comments  pt supine    Joint Mobilization  Lt GH joint circumduction/distraction    Soft tissue mobilization  soft tissue work through the Lt shoulder girdle musculature. TPR to subscap and infraspinatus.    Myofascial Release  anterior Lt shoulder    Scapular Mobilization  Lt     Passive ROM  Lt shoulder flexion, scaption, ER IR, extension, horizontal abduction  PT Long Term Goals - 09/20/17 1554      PT LONG TERM GOAL #1   Title  Decrease Lt shoulder pain by 50-75% allowing patient to lift Lt UE to reach shelf at head height with minimal to no pain 11/01/17    Time  6    Period  Weeks    Status  New      PT LONG TERM GOAL #2   Title  Increase Lt shoulder AROM to =/> than Rt shoulder AROM 11/01/17    Time  6    Period  Weeks    Status  New      PT LONG TERM GOAL #3   Title  Patient reports return to full functional use of Lt UE 11/01/17    Time  6    Period  Weeks    Status  New      PT LONG TERM GOAL #4   Title  Improve posture and alignment through the thoracic spine and shoulder girdle with patient to demonstrate good upright porture to improve shoulder mechanics with Lt shoulder function 11/01/17    Time  6    Period  Weeks    Status  New      PT LONG TERM GOAL #5   Title  Independent in HEP 11/01/17    Time  6    Period  Weeks    Status  New      PT LONG TERM GOAL #6   Title  Improve FOTO to </= 26% limtiation 11/01/17    Time  6    Period   Weeks    Status  New            Plan - 10/16/17 1715    Clinical Impression Statement  Continued improvement in Lt shoulder ROM and tissue extensibility.  some banding and tenderness in Lt levator today.  Pt will benefit from continued PT intervention to maximize functional mobility.      Rehab Potential  Good    PT Frequency  2x / week    PT Duration  6 weeks    PT Treatment/Interventions  Patient/family education;ADLs/Self Care Home Management;Cryotherapy;Electrical Stimulation;Iontophoresis 52m/ml Dexamethasone;Moist Heat;Ultrasound;Dry needling;Manual techniques;Neuromuscular re-education;Therapeutic activities;Therapeutic exercise    PT Next Visit Plan  manual therapy to Lt shoulder (possible DN).  continued focus on ROM.        Patient will benefit from skilled therapeutic intervention in order to improve the following deficits and impairments:  Postural dysfunction, Improper body mechanics, Pain, Increased fascial restricitons, Increased muscle spasms, Impaired UE functional use, Decreased mobility, Decreased range of motion, Decreased activity tolerance  Visit Diagnosis: Acute pain of left shoulder  Abnormal posture  Other symptoms and signs involving the musculoskeletal system     Problem List Patient Active Problem List   Diagnosis Date Noted  . Hypothyroid   . Anxiety   . Tinea versicolor   . Vitamin D deficiency    JKerin Perna PTA 10/16/17 5:18 PM  [facility] [place] [address]Phone: [phone]  Fax:  [phone] Name: Yolanda READERMRN: [id number] of Birth: [birth year] PHYSICAL THERAPY DISCHARGE SUMMARY  Visits from Start of Care: 7  Current functional level related to goals / functional outcomes: See progress note for discharge status    Remaining deficits: Doing well at discharge - needs to continue with HEP    Education / Equipment: HEP   Plan: Patient  agrees to discharge.  Patient goals were partially met. Patient is being discharged due to being pleased with the current functional level.  ?????     Celyn P. Helene Kelp PT, MPH 12/10/17 12:35 PM

## 2017-10-30 ENCOUNTER — Encounter: Payer: BLUE CROSS/BLUE SHIELD | Admitting: Physical Therapy

## 2018-09-14 IMAGING — DX DG SHOULDER 2+V*L*
3 series · 3 of 3 positions shown · non-contrast
Comparison: None.

CLINICAL DATA: Pain for 2 months

EXAM:
LEFT SHOULDER - 2+ VIEW

[shoulder grashey]
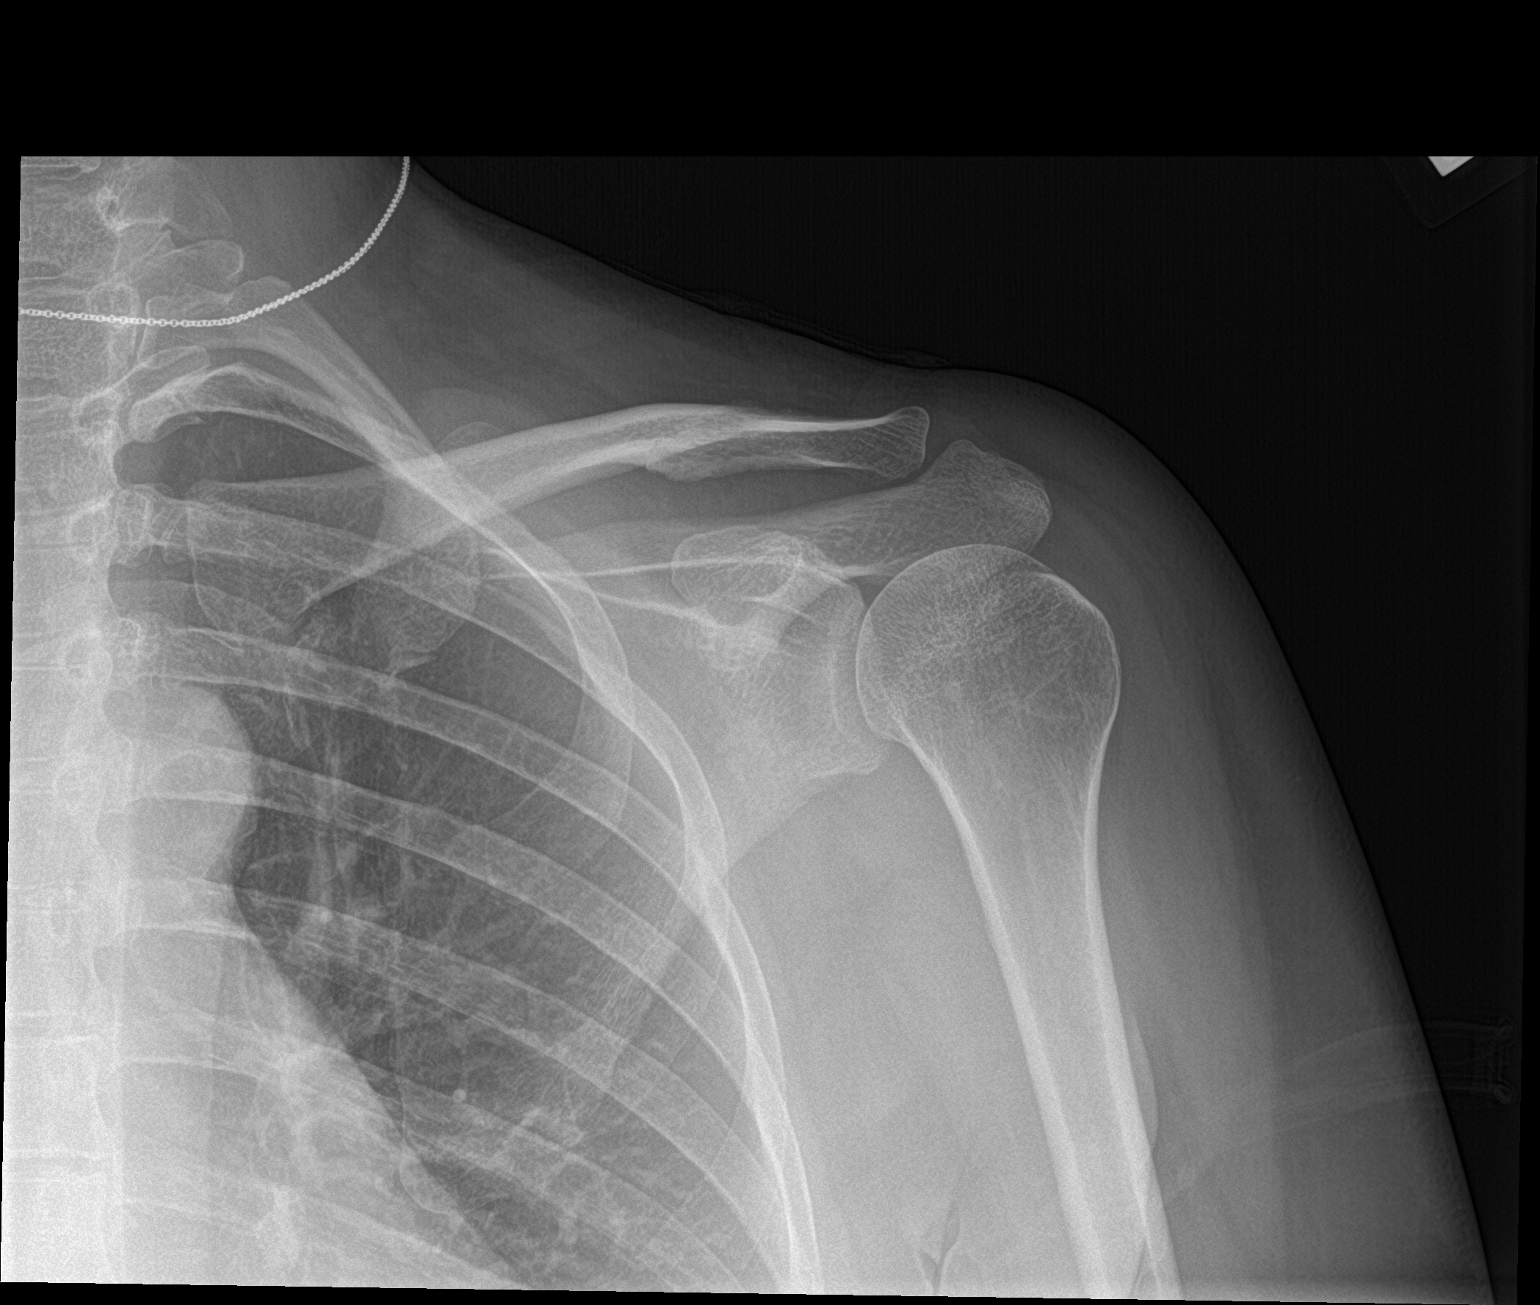

[shoulder y view]
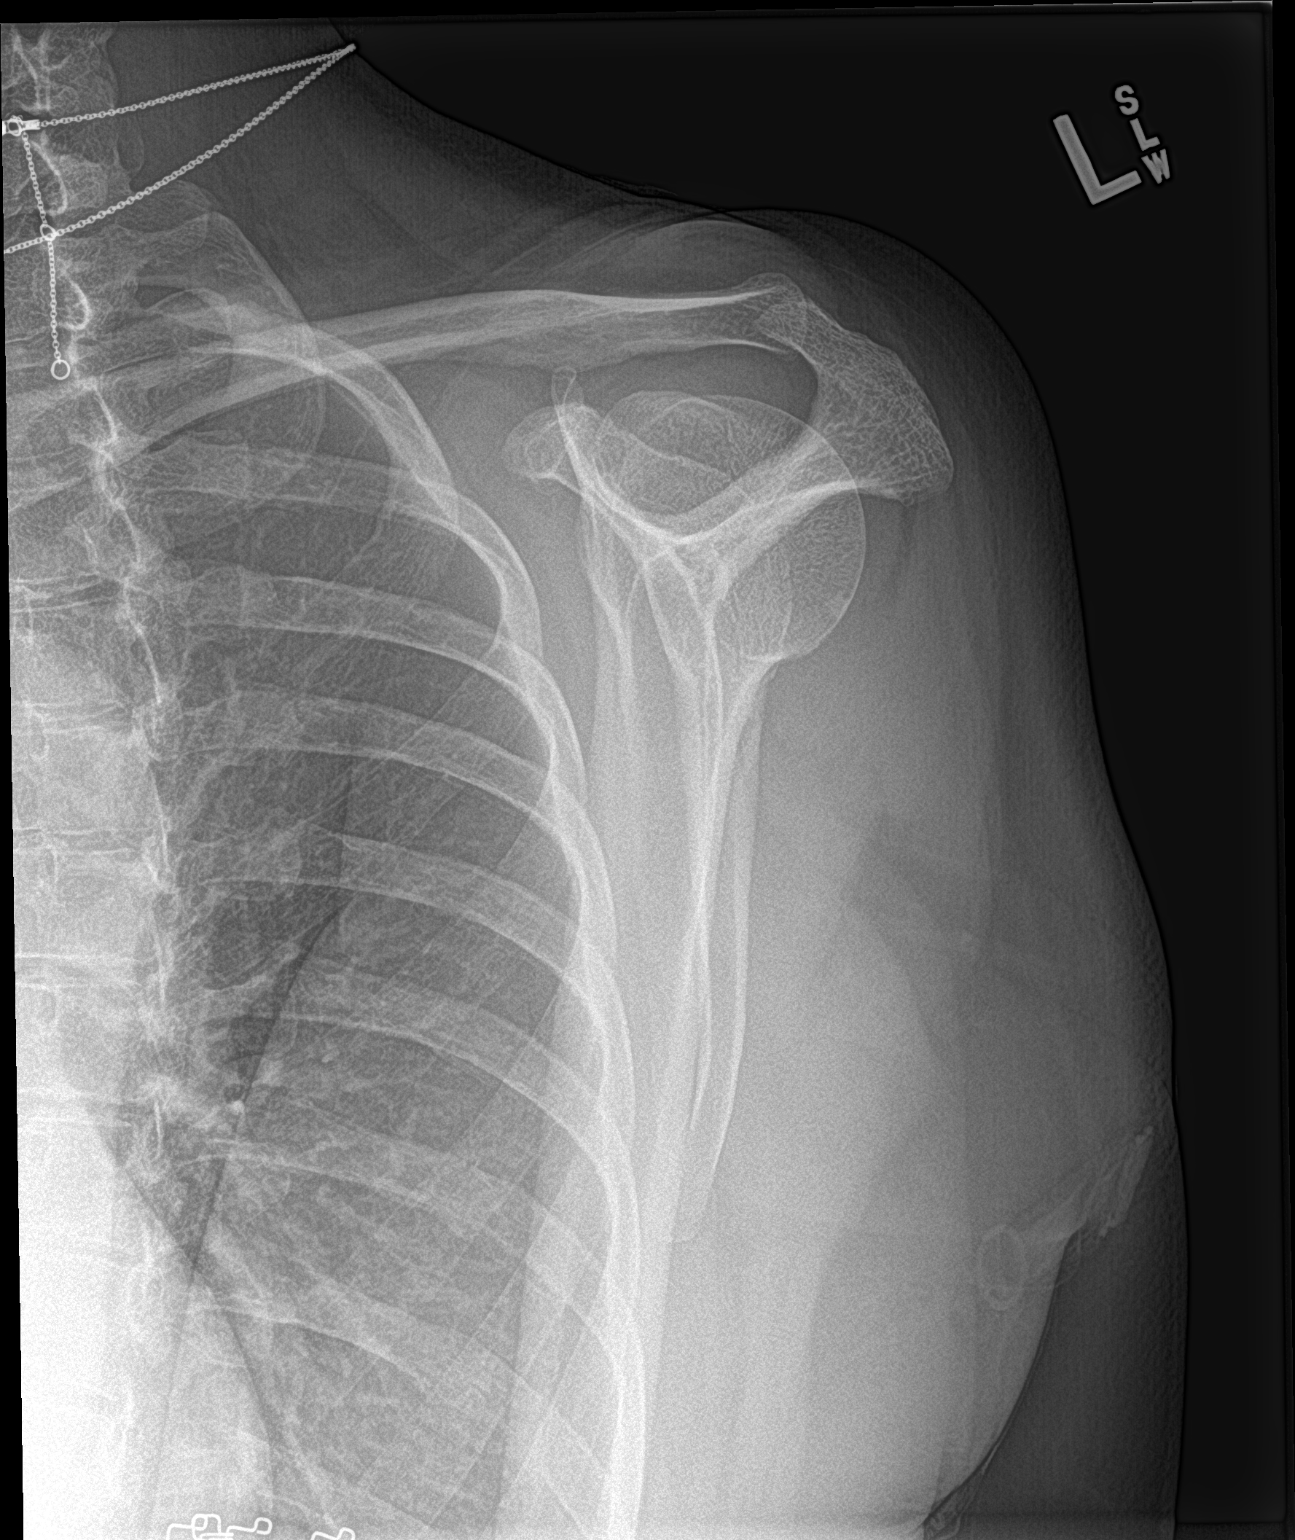

[shoulder axillary]
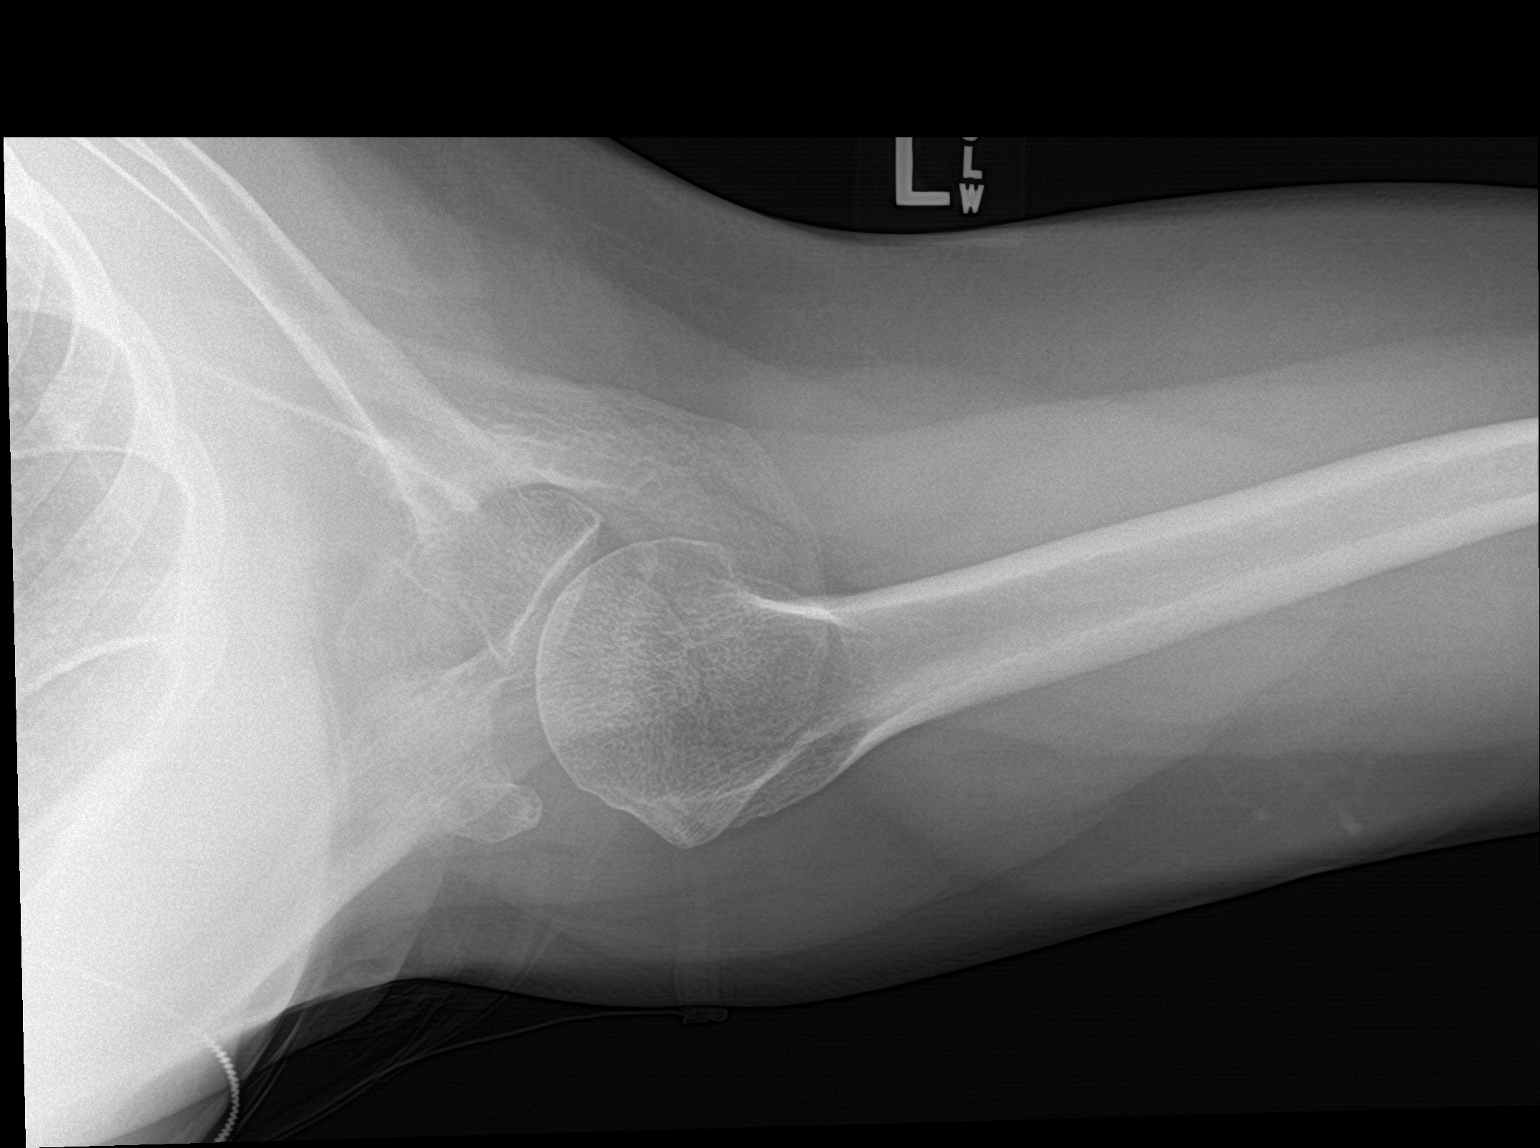

[3 of 3 positions shown; findings below may reference images not displayed]

FINDINGS: Frontal, Y scapular, and axillary images were obtained. No fracture
or dislocation. Joint spaces appear normal. No erosive change.
Visualized left lung clear.
IMPRESSION: No fracture or dislocation.  No evident arthropathy.
